# Patient Record
Sex: Female | Born: 1940 | Race: White | Hispanic: No
Health system: Southern US, Community
[De-identification: ages and names within clinical notes are randomized; demographics above are authoritative.]

## PROBLEM LIST (undated history)

## (undated) DIAGNOSIS — J159 Unspecified bacterial pneumonia: Secondary | ICD-10-CM

## (undated) DIAGNOSIS — N184 Chronic kidney disease, stage 4 (severe): Secondary | ICD-10-CM

## (undated) DIAGNOSIS — J9621 Acute and chronic respiratory failure with hypoxia: Secondary | ICD-10-CM

## (undated) DIAGNOSIS — K922 Gastrointestinal hemorrhage, unspecified: Secondary | ICD-10-CM

---

## 2017-10-31 ENCOUNTER — Ambulatory Visit (HOSPITAL_COMMUNITY): Payer: Self-pay | Admitting: Psychiatry

## 2017-12-11 ENCOUNTER — Ambulatory Visit (HOSPITAL_COMMUNITY): Payer: Medicare Other | Admitting: Psychiatry

## 2019-01-01 ENCOUNTER — Inpatient Hospital Stay
Admission: RE | Admit: 2019-01-01 | Discharge: 2019-01-22 | Disposition: A | Discharge status: Other Acute Inpatient Hospital | Payer: Medicare Other | Source: Other Acute Inpatient Hospital | Attending: Internal Medicine | Admitting: Internal Medicine

## 2019-01-01 ENCOUNTER — Other Ambulatory Visit (HOSPITAL_COMMUNITY): Payer: Medicare Other

## 2019-01-01 DIAGNOSIS — Z4901 Encounter for fitting and adjustment of extracorporeal dialysis catheter: Secondary | ICD-10-CM

## 2019-01-01 DIAGNOSIS — K922 Gastrointestinal hemorrhage, unspecified: Secondary | ICD-10-CM | POA: Diagnosis present

## 2019-01-01 DIAGNOSIS — Z0189 Encounter for other specified special examinations: Secondary | ICD-10-CM

## 2019-01-01 DIAGNOSIS — Z931 Gastrostomy status: Secondary | ICD-10-CM

## 2019-01-01 DIAGNOSIS — J9690 Respiratory failure, unspecified, unspecified whether with hypoxia or hypercapnia: Secondary | ICD-10-CM

## 2019-01-01 DIAGNOSIS — R509 Fever, unspecified: Secondary | ICD-10-CM

## 2019-01-01 DIAGNOSIS — J9621 Acute and chronic respiratory failure with hypoxia: Secondary | ICD-10-CM | POA: Diagnosis present

## 2019-01-01 DIAGNOSIS — T829XXA Unspecified complication of cardiac and vascular prosthetic device, implant and graft, initial encounter: Secondary | ICD-10-CM

## 2019-01-01 DIAGNOSIS — J159 Unspecified bacterial pneumonia: Secondary | ICD-10-CM | POA: Diagnosis present

## 2019-01-01 DIAGNOSIS — Z4659 Encounter for fitting and adjustment of other gastrointestinal appliance and device: Secondary | ICD-10-CM

## 2019-01-01 DIAGNOSIS — N179 Acute kidney failure, unspecified: Secondary | ICD-10-CM

## 2019-01-01 DIAGNOSIS — N184 Chronic kidney disease, stage 4 (severe): Secondary | ICD-10-CM | POA: Diagnosis present

## 2019-01-01 DIAGNOSIS — Z992 Dependence on renal dialysis: Secondary | ICD-10-CM

## 2019-01-01 DIAGNOSIS — Z9911 Dependence on respirator [ventilator] status: Secondary | ICD-10-CM

## 2019-01-01 HISTORY — DX: Gastrointestinal hemorrhage, unspecified: K92.2

## 2019-01-01 HISTORY — DX: Chronic kidney disease, stage 4 (severe): N18.4

## 2019-01-01 HISTORY — DX: Acute and chronic respiratory failure with hypoxia: J96.21

## 2019-01-01 HISTORY — DX: Unspecified bacterial pneumonia: J15.9

## 2019-01-02 ENCOUNTER — Other Ambulatory Visit (HOSPITAL_COMMUNITY): Payer: Medicare Other

## 2019-01-02 LAB — CBC WITH DIFFERENTIAL/PLATELET
Abs Immature Granulocytes: 0.08 10*3/uL — ABNORMAL HIGH (ref 0.00–0.07)
Basophils Absolute: 0.1 10*3/uL (ref 0.0–0.1)
Basophils Relative: 1 %
Eosinophils Absolute: 0.4 10*3/uL (ref 0.0–0.5)
Eosinophils Relative: 7 %
HCT: 29.5 % — ABNORMAL LOW (ref 36.0–46.0)
Hemoglobin: 9.3 g/dL — ABNORMAL LOW (ref 12.0–15.0)
Immature Granulocytes: 1 %
Lymphocytes Relative: 14 %
Lymphs Abs: 0.8 10*3/uL (ref 0.7–4.0)
MCH: 28.7 pg (ref 26.0–34.0)
MCHC: 31.5 g/dL (ref 30.0–36.0)
MCV: 91 fL (ref 80.0–100.0)
Monocytes Absolute: 0.4 10*3/uL (ref 0.1–1.0)
Monocytes Relative: 7 %
Neutro Abs: 4.2 10*3/uL (ref 1.7–7.7)
Neutrophils Relative %: 70 %
Platelets: 156 10*3/uL (ref 150–400)
RBC: 3.24 MIL/uL — ABNORMAL LOW (ref 3.87–5.11)
RDW: 16.5 % — ABNORMAL HIGH (ref 11.5–15.5)
WBC: 5.9 10*3/uL (ref 4.0–10.5)
nRBC: 0 % (ref 0.0–0.2)

## 2019-01-02 LAB — COMPREHENSIVE METABOLIC PANEL
ALT: 19 U/L (ref 0–44)
AST: 23 U/L (ref 15–41)
Albumin: 2.3 g/dL — ABNORMAL LOW (ref 3.5–5.0)
Alkaline Phosphatase: 147 U/L — ABNORMAL HIGH (ref 38–126)
Anion gap: 13 (ref 5–15)
BUN: 112 mg/dL — ABNORMAL HIGH (ref 8–23)
CO2: 26 mmol/L (ref 22–32)
Calcium: 9.7 mg/dL (ref 8.9–10.3)
Chloride: 102 mmol/L (ref 98–111)
Creatinine, Ser: 3.44 mg/dL — ABNORMAL HIGH (ref 0.44–1.00)
GFR calc Af Amer: 14 mL/min — ABNORMAL LOW (ref >60)
GFR calc non Af Amer: 12 mL/min — ABNORMAL LOW (ref >60)
Glucose, Bld: 92 mg/dL (ref 70–99)
Potassium: 4.2 mmol/L (ref 3.5–5.1)
Sodium: 141 mmol/L (ref 135–145)
Total Bilirubin: 1.1 mg/dL (ref 0.3–1.2)
Total Protein: 5.6 g/dL — ABNORMAL LOW (ref 6.5–8.1)

## 2019-01-02 LAB — PROTIME-INR
INR: 1.2 (ref 0.8–1.2)
Prothrombin Time: 15.4 seconds — ABNORMAL HIGH (ref 11.4–15.2)

## 2019-01-02 LAB — BLOOD GAS, ARTERIAL
Acid-Base Excess: 0.4 mmol/L (ref 0.0–2.0)
Bicarbonate: 25.5 mmol/L (ref 20.0–28.0)
FIO2: 35
MECHVT: 400 mL
O2 Saturation: 94.9 %
PEEP: 5 cmH2O
Patient temperature: 98.6
RATE: 18 resp/min
pCO2 arterial: 48.2 mmHg — ABNORMAL HIGH (ref 32.0–48.0)
pH, Arterial: 7.343 — ABNORMAL LOW (ref 7.350–7.450)
pO2, Arterial: 78.4 mmHg — ABNORMAL LOW (ref 83.0–108.0)

## 2019-01-02 LAB — TSH: TSH: 5.474 u[IU]/mL — ABNORMAL HIGH (ref 0.350–4.500)

## 2019-01-02 LAB — PHOSPHORUS: Phosphorus: 4.6 mg/dL (ref 2.5–4.6)

## 2019-01-02 LAB — T4, FREE: Free T4: 1.02 ng/dL (ref 0.61–1.12)

## 2019-01-02 LAB — MAGNESIUM: Magnesium: 1.6 mg/dL — ABNORMAL LOW (ref 1.7–2.4)

## 2019-01-02 LAB — VANCOMYCIN, RANDOM: Vancomycin Rm: 25

## 2019-01-03 LAB — RENAL FUNCTION PANEL
Albumin: 2 g/dL — ABNORMAL LOW (ref 3.5–5.0)
Anion gap: 16 — ABNORMAL HIGH (ref 5–15)
BUN: 117 mg/dL — ABNORMAL HIGH (ref 8–23)
CO2: 24 mmol/L (ref 22–32)
Calcium: 9.5 mg/dL (ref 8.9–10.3)
Chloride: 101 mmol/L (ref 98–111)
Creatinine, Ser: 3.64 mg/dL — ABNORMAL HIGH (ref 0.44–1.00)
GFR calc Af Amer: 13 mL/min — ABNORMAL LOW (ref >60)
GFR calc non Af Amer: 11 mL/min — ABNORMAL LOW (ref >60)
Glucose, Bld: 74 mg/dL (ref 70–99)
Phosphorus: 5.3 mg/dL — ABNORMAL HIGH (ref 2.5–4.6)
Potassium: 4 mmol/L (ref 3.5–5.1)
Sodium: 141 mmol/L (ref 135–145)

## 2019-01-03 LAB — MAGNESIUM: Magnesium: 1.9 mg/dL (ref 1.7–2.4)

## 2019-01-04 ENCOUNTER — Encounter (HOSPITAL_COMMUNITY): Payer: Self-pay | Admitting: Interventional Radiology

## 2019-01-04 ENCOUNTER — Other Ambulatory Visit (HOSPITAL_COMMUNITY): Payer: Medicare Other

## 2019-01-04 DIAGNOSIS — K922 Gastrointestinal hemorrhage, unspecified: Secondary | ICD-10-CM | POA: Diagnosis not present

## 2019-01-04 DIAGNOSIS — J159 Unspecified bacterial pneumonia: Secondary | ICD-10-CM

## 2019-01-04 DIAGNOSIS — J9621 Acute and chronic respiratory failure with hypoxia: Secondary | ICD-10-CM

## 2019-01-04 DIAGNOSIS — N184 Chronic kidney disease, stage 4 (severe): Secondary | ICD-10-CM | POA: Diagnosis not present

## 2019-01-04 DIAGNOSIS — Z9911 Dependence on respirator [ventilator] status: Secondary | ICD-10-CM

## 2019-01-04 HISTORY — PX: IR FLUORO GUIDE CV LINE LEFT: IMG2282

## 2019-01-04 HISTORY — PX: IR US GUIDE VASC ACCESS LEFT: IMG2389

## 2019-01-04 LAB — CBC
HCT: 27.5 % — ABNORMAL LOW (ref 36.0–46.0)
Hemoglobin: 8.5 g/dL — ABNORMAL LOW (ref 12.0–15.0)
MCH: 28.2 pg (ref 26.0–34.0)
MCHC: 30.9 g/dL (ref 30.0–36.0)
MCV: 91.4 fL (ref 80.0–100.0)
Platelets: 162 10*3/uL (ref 150–400)
RBC: 3.01 MIL/uL — ABNORMAL LOW (ref 3.87–5.11)
RDW: 16.1 % — ABNORMAL HIGH (ref 11.5–15.5)
WBC: 6.5 10*3/uL (ref 4.0–10.5)
nRBC: 0 % (ref 0.0–0.2)

## 2019-01-04 LAB — URINALYSIS, ROUTINE W REFLEX MICROSCOPIC
Bilirubin Urine: NEGATIVE
Glucose, UA: NEGATIVE mg/dL
Ketones, ur: NEGATIVE mg/dL
Nitrite: NEGATIVE
Protein, ur: 30 mg/dL — AB
RBC / HPF: >50 RBC/hpf — ABNORMAL HIGH (ref 0–5)
Specific Gravity, Urine: 1.012 (ref 1.005–1.030)
WBC, UA: >50 WBC/hpf — ABNORMAL HIGH (ref 0–5)
pH: 5 (ref 5.0–8.0)

## 2019-01-04 LAB — RENAL FUNCTION PANEL
Albumin: 1.9 g/dL — ABNORMAL LOW (ref 3.5–5.0)
Anion gap: 12 (ref 5–15)
BUN: 122 mg/dL — ABNORMAL HIGH (ref 8–23)
CO2: 26 mmol/L (ref 22–32)
Calcium: 9.1 mg/dL (ref 8.9–10.3)
Chloride: 102 mmol/L (ref 98–111)
Creatinine, Ser: 3.92 mg/dL — ABNORMAL HIGH (ref 0.44–1.00)
GFR calc Af Amer: 12 mL/min — ABNORMAL LOW (ref >60)
GFR calc non Af Amer: 10 mL/min — ABNORMAL LOW (ref >60)
Glucose, Bld: 131 mg/dL — ABNORMAL HIGH (ref 70–99)
Phosphorus: 4.6 mg/dL (ref 2.5–4.6)
Potassium: 3.9 mmol/L (ref 3.5–5.1)
Sodium: 140 mmol/L (ref 135–145)

## 2019-01-04 LAB — CLOSTRIDIUM DIFFICILE BY PCR, REFLEXED: Toxigenic C. Difficile by PCR: POSITIVE — AB

## 2019-01-04 LAB — VANCOMYCIN, TROUGH: Vancomycin Tr: 29 ug/mL (ref 15–20)

## 2019-01-04 LAB — C DIFFICILE QUICK SCREEN W PCR REFLEX
C Diff antigen: POSITIVE — AB
C Diff toxin: NEGATIVE

## 2019-01-04 MED ORDER — HEPARIN SODIUM (PORCINE) 1000 UNIT/ML IJ SOLN
INTRAMUSCULAR | Status: AC
  Administered 2019-01-04: 2.8 mL
  Filled 2019-01-04: qty 1

## 2019-01-04 MED ORDER — LIDOCAINE HCL 1 % IJ SOLN
INTRAMUSCULAR | Status: AC
  Filled 2019-01-04: qty 20

## 2019-01-04 MED ORDER — LIDOCAINE HCL (PF) 1 % IJ SOLN
INTRAMUSCULAR | Status: DC | PRN
  Administered 2019-01-04: 30 mL

## 2019-01-04 NOTE — Procedures (Signed)
  Procedure: L IJ Trialysis 20cm HD catheter   EBL:   minimal Complications:  none immediate  See full dictation in [facility].  Dillard Cannon MD Main # [phone] Pager  [phone]

## 2019-01-04 NOTE — Consult Note (Signed)
[facility]  PULMONARY SERVICE  Date of Service: 01/04/2019  PULMONARY CRITICAL CARE CONSULT   Vincenta Szoke  [id number]  DOB: 28-Aug-1940   DOA: 01/01/2019  Referring Physician: Merton Border, MD  HPI: Stephanie Stark is a 78 y.o. female seen for follow up of Acute on Chronic Respiratory Failure.  Patient has multiple medical problems including congestive heart failure chronic kidney disease hyperlipidemia rheumatoid arthritis fibromyalgia giant cell arteritis hypertension aortic aneurysm duodenal ulcers diverticulosis who presented to the hospital because of increasing shortness of breath.  Patient apparently was having a.m. upper and lower GI bleed and required multiple transfusions.  She did undergo evaluation including colonoscopy and distal diverticulosis which was apparently severe.  The patient subsequently did develop acute renal failure with oliguria patient did develop respiratory failure and ended up intubated on the ventilator.  Right now patient is on ventilator and is on pressure support mode currently on 40% FiO2 with volumes of about 382 cc.  We are asked to see her for further management and weaning.  Review of Systems:  ROS performed and is unremarkable other than noted above.  Allergies  Allergen Reactions  . Shrimp Rash  Rash and swelling  . Keflex [Cephalexin] Diarrhea   Past Medical History:  Diagnosis Date  . (HFpEF) heart failure with preserved ejection fraction (CMS-HCC) 06/24/2017  . Arthritis, rheumatoid (CMS-HCC)  . CHF NYHA class II, chronic, diastolic (CMS-HCC) 123XX123  . CKD (chronic kidney disease) stage 2, GFR 60-89 ml/min 11/12/2016  . Dyslipidemia  . Fibromyalgia  . Giant cell arteritis (CMS-HCC)  . HTN (hypertension)  . Nonrheumatic aortic valve insufficiency 06/17/2017  . Thoracic ascending aortic aneurysm (CMS-HCC) 11/12/2016   Past Surgical History:  Procedure Laterality Date  .  CHOLECYSTECTOMY  . hip fracture repair Right  . HYSTERECTOMY VAGINAL  . REPLACEMENT TOTAL KNEE Left     Medications: Reviewed on Rounds  Physical Exam:  Vitals: Temperature 98.7 pulse 101 respiratory 18 blood pressure 128/66 saturations 100%  Ventilator Settings mode of ventilation pressure support FiO2 is 40% tidal volume 382 pressure support 12 PEEP 5  . General: Comfortable at this time . Eyes: Grossly normal lids, irises & conjunctiva . ENT: grossly tongue is normal . Neck: no obvious mass . Cardiovascular: S1-S2 normal no gallop or rub . Respiratory: No rhonchi no rales are noted . Abdomen: Soft and nontender . Skin: no rash seen on limited exam . Musculoskeletal: not rigid . Psychiatric:unable to assess . Neurologic: no seizure no involuntary movements         Labs on Admission:  Basic Metabolic Panel: Recent Labs  Lab 01/02/19 0557 01/03/19 0629 01/04/19 0807  NA 141 141 140  K 4.2 4.0 3.9  CL 102 101 102  CO2 26 24 26   GLUCOSE 92 74 131*  BUN 112* 117* 122*  CREATININE 3.44* 3.64* 3.92*  CALCIUM 9.7 9.5 9.1  MG 1.6* 1.9  --   PHOS 4.6 5.3* 4.6    Recent Labs  Lab 01/02/19 0400  PHART 7.343*  PCO2ART 48.2*  PO2ART 78.4*  HCO3 25.5  O2SAT 94.9    Liver Function Tests: Recent Labs  Lab 01/02/19 0557 01/03/19 0629 01/04/19 0807  AST 23  --   --   ALT 19  --   --   ALKPHOS 147*  --   --   BILITOT 1.1  --   --   PROT 5.6*  --   --   ALBUMIN 2.3* 2.0*  1.9*   No results for input(s): LIPASE, AMYLASE in the last 168 hours. No results for input(s): AMMONIA in the last 168 hours.  CBC: Recent Labs  Lab 01/02/19 0557 01/04/19 0807  WBC 5.9 6.5  NEUTROABS 4.2  --   HGB 9.3* 8.5*  HCT 29.5* 27.5*  MCV 91.0 91.4  PLT 156 162    Cardiac Enzymes: No results for input(s): CKTOTAL, CKMB, CKMBINDEX, TROPONINI in the last 168 hours.  BNP (last 3 results) No results for input(s): BNP in the last 8760 hours.  ProBNP (last 3 results) No  results for input(s): PROBNP in the last 8760 hours.   Radiological Exams on Admission: Dg Abd 1 View  Result Date: 01/02/2019 CLINICAL DATA:  Evaluate NG tube placement EXAM: ABDOMEN - 1 VIEW COMPARISON:  Earlier today. FINDINGS: 1207 hours. NG tube tip is in the mid stomach with proximal port of the NG tube at or just distal to the EG junction. Nonspecific bowel gas pattern without substantial small bowel or colonic gaseous dilatation. The radiopaque foreign bodies over the right abdomen are similar to prior. Degenerative changes noted lumbar spine. IMPRESSION: NG tube tip is in the mid stomach. Electronically Signed   By: Misty Stanley M.D.   On: 01/02/2019 12:37   Dg Abd 1 View  Result Date: 01/01/2019 CLINICAL DATA:  NG tube placement. EXAM: ABDOMEN - 1 VIEW COMPARISON:  None. FINDINGS: NG tube tip is in the left upper quadrant of the abdomen, probably in the midbody of the stomach. No dilated bowel. No acute abnormality of the visualized portion of the abdomen. IMPRESSION: NG tube tip in the midbody of the stomach. Electronically Signed   By: Lorriane Shire M.D.   On: 01/01/2019 20:48   US Renal  Result Date: 01/02/2019 CLINICAL DATA:  Acute renal failure. EXAM: RENAL / URINARY TRACT ULTRASOUND COMPLETE COMPARISON:  None. FINDINGS: Right Kidney: Renal measurements: 8.6 x 4.5 x 4.7 cm = volume: 93.3 mL . Echogenicity within normal limits. No mass or hydronephrosis visualized. Left Kidney: Renal measurements: 10.6 x 4.1 x 4.6 cm = volume: 104.4 mL. Echogenicity within normal limits. No mass or hydronephrosis visualized. Bladder: Decompressed with Foley catheter. Left pleural effusion. IMPRESSION: No hydronephrosis. Electronically Signed   By: Lovey Newcomer M.D.   On: 01/02/2019 14:17   Ir Fluoro Guide Cv Line Left  Result Date: 01/04/2019 CLINICAL DATA:  Chronic kidney disease stage 4, needs access for hemodialysis EXAM: EXAM RIGHT IJ CATHETER PLACEMENT UNDER ULTRASOUND AND FLUOROSCOPIC GUIDANCE  TECHNIQUE: Patency of the left IJ vein was confirmed with ultrasound with image documentation. An appropriate skin site was determined. Skin site was marked. Region was prepped using maximum barrier technique including cap and mask, sterile gown, sterile gloves, large sterile sheet, and Chlorhexidine as cutaneous antisepsis. The region was infiltrated locally with 1% lidocaine. Under real-time ultrasound guidance, the left IJ vein was accessed with micropuncture set; the needle tip within the vein was confirmed with ultrasound image documentation. The micropuncture dilator over a guidewire for vascular dilator which allowed advancement of a 20 cm Trialysis catheter. This was positioned with the tip at the cavoatrial junction. Spot chest radiograph shows good positioning and no pneumothorax. Catheter was flushed and sutured externally with 0-Prolene sutures. Patient tolerated the procedure well. FLUOROSCOPY TIME:  0.2 minute; 11 uGym2 DAP COMPLICATIONS: COMPLICATIONS none IMPRESSION: 1. Technically successful left IJ Trialysis catheter placement. Electronically Signed   By: Lucrezia Europe M.D.   On: 01/04/2019 16:39   Ir US  Guide Vasc Access Left  Result Date: 01/04/2019 CLINICAL DATA:  Chronic kidney disease stage 4, needs access for hemodialysis EXAM: EXAM RIGHT IJ CATHETER PLACEMENT UNDER ULTRASOUND AND FLUOROSCOPIC GUIDANCE TECHNIQUE: Patency of the left IJ vein was confirmed with ultrasound with image documentation. An appropriate skin site was determined. Skin site was marked. Region was prepped using maximum barrier technique including cap and mask, sterile gown, sterile gloves, large sterile sheet, and Chlorhexidine as cutaneous antisepsis. The region was infiltrated locally with 1% lidocaine. Under real-time ultrasound guidance, the left IJ vein was accessed with micropuncture set; the needle tip within the vein was confirmed with ultrasound image documentation. The micropuncture dilator over a guidewire for  vascular dilator which allowed advancement of a 20 cm Trialysis catheter. This was positioned with the tip at the cavoatrial junction. Spot chest radiograph shows good positioning and no pneumothorax. Catheter was flushed and sutured externally with 0-Prolene sutures. Patient tolerated the procedure well. FLUOROSCOPY TIME:  0.2 minute; 11 uGym2 DAP COMPLICATIONS: COMPLICATIONS none IMPRESSION: 1. Technically successful left IJ Trialysis catheter placement. Electronically Signed   By: Lucrezia Europe M.D.   On: 01/04/2019 16:39   Dg Chest Port 1 View  Result Date: 01/02/2019 CLINICAL DATA:  78 year old female with history of respiratory failure. EXAM: PORTABLE CHEST 1 VIEW COMPARISON:  No priors. FINDINGS: An endotracheal tube is in place with tip 3.2 cm above the carina. A nasogastric tube is seen extending into the stomach, however, the tip of the nasogastric tube extends below the lower margin of the image. Patchy multifocal interstitial and airspace disease throughout the lungs bilaterally, with relative sparing in the right lung base. Moderate left and small right pleural effusions. Heart size is borderline enlarged. The patient is rotated to the right on today's exam, resulting in distortion of the mediastinal contours and reduced diagnostic sensitivity and specificity for mediastinal pathology. IMPRESSION: 1. Support apparatus, as above. 2. The appearance the chest suggests severe multilobar pneumonia. 3. Moderate left and small right pleural effusions. Electronically Signed   By: Vinnie Langton M.D.   On: 01/02/2019 08:38   Dg Abd Portable 1v  Result Date: 01/02/2019 CLINICAL DATA:  NG tube placement. EXAM: PORTABLE ABDOMEN - 1 VIEW COMPARISON:  01/01/2019 FINDINGS: Patient rotated to the right. Nasogastric tube is present with tip over the stomach just left of midline as the side port is in the region of the gastroesophageal junction. Bowel gas pattern is nonobstructive. Remainder of the exam is  unchanged. IMPRESSION: Nonobstructive bowel gas pattern. Nasogastric tube with tip over the stomach in the left upper quadrant and side-port at the level of the gastroesophageal junction. Electronically Signed   By: Marin Olp M.D.   On: 01/02/2019 08:38    Assessment/Plan Active Problems:   Acute on chronic respiratory failure with hypoxia (HCC)   Chronic kidney disease, stage IV (severe) (HCC)   Acute lower GI bleeding   Bacterial lobar pneumonia   1. Acute on chronic respiratory failure with hypoxia at this time patient is on the wean protocol started on pressure support 12/5.  We will continue to advance on the wean as tolerated.  Continue secretion management pulmonary toilet. 2. Chronic kidney disease stage IV nephrology is following the patient for the urine output.  Patient will be started on hemodialysis. 3. Acute GI bleed we will monitor the labs closely monitor the hemoglobin and transfuse as necessary.  The patient did have an abdominal x-ray which shows a nonobstructive bowel gas pattern. 4. Multi lobar  pneumonia chest x-ray still showing significant lobar pneumonia along with a moderate left and right-sided pleural effusion.  We will continue with supportive care discussed antibiotics with primary care team  I have personally seen and evaluated the patient, evaluated laboratory and imaging results, formulated the assessment and plan and placed orders. The Patient requires high complexity decision making for assessment and support.  Case was discussed on Rounds with the Respiratory Therapy Staff Time Spent 29minutes  Allyne Gee, MD [facility] Pulmonary Critical Care Medicine Sleep Medicine

## 2019-01-04 NOTE — Consult Note (Signed)
[facility] CONSULT NOTE    Date: 01/04/2019                  Patient Name:  Stephanie Stark  MRN: [id number]  DOB: 1940/09/26  Age / Sex: 78 y.o., female         PCP: Kennieth Rad, MD                 Service Requesting Consult: Hospitalist                 Reason for Consult: Acute renal failure/CKD stage IV            History of Present Illness: Patient is a 78 y.o. female with a PMHx of congestive heart failure, rheumatoid arthritis, chronic kidney disease stage IV, hyperlipidemia, fibromyalgia, giant cell arteritis, hypertension, thoracic ascending aortic aneurysm, duodenal ulcers, severe diverticulosis, coronary artery disease, hypothyroidism, who was admitted to [facility] on 01/01/2019 for ongoing treatment of acute respiratory failure, generalized debility, and severe renal dysfunction.  She was admitted to outside hospital for upper and lower GI bleed and received multiple blood transfusions.  She had colonoscopy performed on November 27, 2018 which demonstrated moderate diverticulosis throughout the entire colon with severe diverticulosis in the sigmoid colon.  Patient developed acute kidney injury and was started on Lasix drip.  Urine output over the past 24 hours was 650 cc.  However BUN is quite high at greater than 100.  We discussed initiation of dialysis this weekend.   Medications:  Current medications: Fentanyl drip, Lasix drip, amlodipine 5 mg 2 tablets daily, carvedilol 12.5 mg twice daily, hydralazine 25 mg 3 times daily, levothyroxine 25 mcg daily, magnesium oxide 400 mg twice daily, mirtazapine 15 mg nightly, pantoprazole 40 mg twice daily, sucralfate 1 g every 6 hours  Allergies: Cephalexin   Past Medical History: congestive heart failure, rheumatoid arthritis, chronic kidney disease stage IV, hyperlipidemia, fibromyalgia, giant cell arteritis, hypertension, thoracic ascending aortic aneurysm, duodenal ulcers, severe diverticulosis, coronary  artery disease, hypothyroidism,  Past Surgical History: . CHOLECYSTECTOMY  . hip fracture repair Right  . HYSTERECTOMY VAGINAL  . REPLACEMENT TOTAL KNEE Left    Family History: Unable to obtain from the patient as she is currently on the ventilator  Social History: Unable to obtain from the patient as she is currently on the ventilator  Review of Systems: Unable to obtain from the patient as she is currently on the ventilator  Vital Signs: Temperature 96.9 pulse 73 respirations 20 blood pressure 127/67  Physical Exam: General: Critically ill-appearing  Head: Normocephalic, atraumatic.  Eyes: Anicteric, EOMI  Nose: Nasogastric tube in place.  Throat: Endotracheal tube noted to be in place  Neck: Supple, trachea midline.  Lungs:  Bilateral rhonchi, vent assisted  Heart: S1S2 no rubs  Abdomen:  Mild distension, BS present  Extremities: Marked edema in all 4 extremeties  Neurologic: Arousable but not following commands  Skin: Weeping from both upper extremities    Lab results: Basic Metabolic Panel: Recent Labs  Lab 01/02/19 0557 01/03/19 0629  NA 141 141  K 4.2 4.0  CL 102 101  CO2 26 24  GLUCOSE 92 74  BUN 112* 117*  CREATININE 3.44* 3.64*  CALCIUM 9.7 9.5  MG 1.6* 1.9  PHOS 4.6 5.3*    Liver Function Tests: Recent Labs  Lab 01/02/19 0557 01/03/19 0629  AST 23  --   ALT 19  --   ALKPHOS 147*  --   BILITOT 1.1  --  PROT 5.6*  --   ALBUMIN 2.3* 2.0*   No results for input(s): LIPASE, AMYLASE in the last 168 hours. No results for input(s): AMMONIA in the last 168 hours.  CBC: Recent Labs  Lab 01/02/19 0557  WBC 5.9  NEUTROABS 4.2  HGB 9.3*  HCT 29.5*  MCV 91.0  PLT 156    Cardiac Enzymes: No results for input(s): CKTOTAL, CKMB, CKMBINDEX, TROPONINI in the last 168 hours.  BNP: Invalid input(s): POCBNP  CBG: No results for input(s): GLUCAP in the last 168 hours.  Microbiology: No results found for this or any previous  visit.  Coagulation Studies: Recent Labs    01/02/19 0557  LABPROT 15.4*  INR 1.2    Urinalysis: No results for input(s): COLORURINE, LABSPEC, PHURINE, GLUCOSEU, HGBUR, BILIRUBINUR, KETONESUR, PROTEINUR, UROBILINOGEN, NITRITE, LEUKOCYTESUR in the last 72 hours.  Invalid input(s): APPERANCEUR    Imaging: Dg Abd 1 View  Result Date: 01/02/2019 CLINICAL DATA:  Evaluate NG tube placement EXAM: ABDOMEN - 1 VIEW COMPARISON:  Earlier today. FINDINGS: 1207 hours. NG tube tip is in the mid stomach with proximal port of the NG tube at or just distal to the EG junction. Nonspecific bowel gas pattern without substantial small bowel or colonic gaseous dilatation. The radiopaque foreign bodies over the right abdomen are similar to prior. Degenerative changes noted lumbar spine. IMPRESSION: NG tube tip is in the mid stomach. Electronically Signed   By: Misty Stanley M.D.   On: 01/02/2019 12:37   US Renal  Result Date: 01/02/2019 CLINICAL DATA:  Acute renal failure. EXAM: RENAL / URINARY TRACT ULTRASOUND COMPLETE COMPARISON:  None. FINDINGS: Right Kidney: Renal measurements: 8.6 x 4.5 x 4.7 cm = volume: 93.3 mL . Echogenicity within normal limits. No mass or hydronephrosis visualized. Left Kidney: Renal measurements: 10.6 x 4.1 x 4.6 cm = volume: 104.4 mL. Echogenicity within normal limits. No mass or hydronephrosis visualized. Bladder: Decompressed with Foley catheter. Left pleural effusion. IMPRESSION: No hydronephrosis. Electronically Signed   By: Lovey Newcomer M.D.   On: 01/02/2019 14:17      Assessment & Plan: Pt is a 78 y.o. female with a PMHx of congestive heart failure, rheumatoid arthritis, chronic kidney disease stage IV, hyperlipidemia, fibromyalgia, giant cell arteritis, hypertension, thoracic ascending aortic aneurysm, duodenal ulcers, severe diverticulosis, coronary artery disease, hypothyroidism, who was admitted to [facility] on 01/01/2019 for ongoing treatment of acute respiratory failure,  generalized debility, and severe renal dysfunction.   1.  Acute renal failure/chronic kidney disease stage IV.  Patient has significant renal dysfunction.  Urine output was only 650 cc and the patient is markedly volume overloaded.  We will start the patient on hemodialysis today.  Treatment time will be 1.5 hours with a blood flow rate of 150 and dialysate flow rate of 300.  2.  Acute respiratory failure.  Patient maintained on the ventilator.  Hopefully with ultrafiltration will be better able to wean the patient off the ventilator.  3.  Anemia of chronic kidney disease.  Most recent hemoglobin was 9.3.  Consider starting medically at this admission.  4.  Overall prognosis guarded.

## 2019-01-05 DIAGNOSIS — J9621 Acute and chronic respiratory failure with hypoxia: Secondary | ICD-10-CM | POA: Diagnosis not present

## 2019-01-05 DIAGNOSIS — K922 Gastrointestinal hemorrhage, unspecified: Secondary | ICD-10-CM | POA: Diagnosis not present

## 2019-01-05 DIAGNOSIS — J159 Unspecified bacterial pneumonia: Secondary | ICD-10-CM | POA: Diagnosis not present

## 2019-01-05 DIAGNOSIS — N184 Chronic kidney disease, stage 4 (severe): Secondary | ICD-10-CM | POA: Diagnosis not present

## 2019-01-05 LAB — RENAL FUNCTION PANEL
Albumin: 1.9 g/dL — ABNORMAL LOW (ref 3.5–5.0)
Anion gap: 14 (ref 5–15)
BUN: 100 mg/dL — ABNORMAL HIGH (ref 8–23)
CO2: 22 mmol/L (ref 22–32)
Calcium: 9.1 mg/dL (ref 8.9–10.3)
Chloride: 101 mmol/L (ref 98–111)
Creatinine, Ser: 3.39 mg/dL — ABNORMAL HIGH (ref 0.44–1.00)
GFR calc Af Amer: 14 mL/min — ABNORMAL LOW (ref >60)
GFR calc non Af Amer: 12 mL/min — ABNORMAL LOW (ref >60)
Glucose, Bld: 145 mg/dL — ABNORMAL HIGH (ref 70–99)
Phosphorus: 4.2 mg/dL (ref 2.5–4.6)
Potassium: 5 mmol/L (ref 3.5–5.1)
Sodium: 137 mmol/L (ref 135–145)

## 2019-01-05 LAB — CBC
HCT: 27.7 % — ABNORMAL LOW (ref 36.0–46.0)
Hemoglobin: 8.7 g/dL — ABNORMAL LOW (ref 12.0–15.0)
MCH: 29.1 pg (ref 26.0–34.0)
MCHC: 31.4 g/dL (ref 30.0–36.0)
MCV: 92.6 fL (ref 80.0–100.0)
Platelets: 159 10*3/uL (ref 150–400)
RBC: 2.99 MIL/uL — ABNORMAL LOW (ref 3.87–5.11)
RDW: 16.3 % — ABNORMAL HIGH (ref 11.5–15.5)
WBC: 8.1 10*3/uL (ref 4.0–10.5)
nRBC: 0 % (ref 0.0–0.2)

## 2019-01-05 LAB — HEPATITIS B SURFACE ANTIGEN: Hepatitis B Surface Ag: NEGATIVE

## 2019-01-05 LAB — URINE CULTURE: Culture: >=100000 — AB

## 2019-01-05 LAB — MAGNESIUM: Magnesium: 1.9 mg/dL (ref 1.7–2.4)

## 2019-01-05 NOTE — Progress Notes (Addendum)
[facility] [place]   PULMONARY CRITICAL CARE SERVICE  PROGRESS NOTE  Date of Service: 01/05/2019  Stephanie Stark  [name]  DOB: February 18, 1941   DOA: 01/01/2019  Referring Physician: Merton Border, MD  HPI: Stephanie Stark is a 78 y.o. female seen for follow up of Acute on Chronic Respiratory Failure.  Patient had a 4-hour goal on pressure support today however failed and is back on full support on the ventilator.  Medications: Reviewed on Rounds  Physical Exam:  Vitals: Pulse 106 respirations 28 BP 124/62 O2 sat 94% temp 97.0  Ventilator Settings ventilator mode AC VC rate of 18 tidal volume 400 PEEP of 5 and FiO2 of 35%  . General: Comfortable at this time . Eyes: Grossly normal lids, irises & conjunctiva . ENT: grossly tongue is normal . Neck: no obvious mass . Cardiovascular: S1 S2 normal no gallop . Respiratory: No rales or rhonchi noted . Abdomen: soft . Skin: no rash seen on limited exam . Musculoskeletal: not rigid . Psychiatric:unable to assess . Neurologic: no seizure no involuntary movements         Lab Data:   Basic Metabolic Panel: Recent Labs  Lab 01/02/19 0557 01/03/19 0629 01/04/19 0807 01/05/19 0643  NA 141 141 140 137  K 4.2 4.0 3.9 5.0  CL 102 101 102 101  CO2 26 24 26 22   GLUCOSE 92 74 131* 145*  BUN 112* 117* 122* 100*  CREATININE 3.44* 3.64* 3.92* 3.39*  CALCIUM 9.7 9.5 9.1 9.1  MG 1.6* 1.9  --  1.9  PHOS 4.6 5.3* 4.6 4.2    ABG: Recent Labs  Lab 01/02/19 0400  PHART 7.343*  PCO2ART 48.2*  PO2ART 78.4*  HCO3 25.5  O2SAT 94.9    Liver Function Tests: Recent Labs  Lab 01/02/19 0557 01/03/19 0629 01/04/19 0807 01/05/19 0643  AST 23  --   --   --   ALT 19  --   --   --   ALKPHOS 147*  --   --   --   BILITOT 1.1  --   --   --   PROT 5.6*  --   --   --   ALBUMIN 2.3* 2.0* 1.9* 1.9*   No results for input(s): LIPASE, AMYLASE in the last 168 hours. No results for input(s):  AMMONIA in the last 168 hours.  CBC: Recent Labs  Lab 01/02/19 0557 01/04/19 0807 01/05/19 0643  WBC 5.9 6.5 8.1  NEUTROABS 4.2  --   --   HGB 9.3* 8.5* 8.7*  HCT 29.5* 27.5* 27.7*  MCV 91.0 91.4 92.6  PLT 156 162 159    Cardiac Enzymes: No results for input(s): CKTOTAL, CKMB, CKMBINDEX, TROPONINI in the last 168 hours.  BNP (last 3 results) No results for input(s): BNP in the last 8760 hours.  ProBNP (last 3 results) No results for input(s): PROBNP in the last 8760 hours.  Radiological Exams: Ir Fluoro Guide Cv Line Left  Result Date: 01/04/2019 CLINICAL DATA:  Chronic kidney disease stage 4, needs access for hemodialysis EXAM: EXAM RIGHT IJ CATHETER PLACEMENT UNDER ULTRASOUND AND FLUOROSCOPIC GUIDANCE TECHNIQUE: Patency of the left IJ vein was confirmed with ultrasound with image documentation. An appropriate skin site was determined. Skin site was marked. Region was prepped using maximum barrier technique including cap and mask, sterile gown, sterile gloves, large sterile sheet, and Chlorhexidine as cutaneous antisepsis. The region was infiltrated locally with 1% lidocaine. Under real-time ultrasound guidance, the left  IJ vein was accessed with micropuncture set; the needle tip within the vein was confirmed with ultrasound image documentation. The micropuncture dilator over a guidewire for vascular dilator which allowed advancement of a 20 cm Trialysis catheter. This was positioned with the tip at the cavoatrial junction. Spot chest radiograph shows good positioning and no pneumothorax. Catheter was flushed and sutured externally with 0-Prolene sutures. Patient tolerated the procedure well. FLUOROSCOPY TIME:  0.2 minute; 11 uGym2 DAP COMPLICATIONS: COMPLICATIONS none IMPRESSION: 1. Technically successful left IJ Trialysis catheter placement. Electronically Signed   By: Lucrezia Europe M.D.   On: 01/04/2019 16:39   Ir US Guide Vasc Access Left  Result Date: 01/04/2019 CLINICAL DATA:   Chronic kidney disease stage 4, needs access for hemodialysis EXAM: EXAM RIGHT IJ CATHETER PLACEMENT UNDER ULTRASOUND AND FLUOROSCOPIC GUIDANCE TECHNIQUE: Patency of the left IJ vein was confirmed with ultrasound with image documentation. An appropriate skin site was determined. Skin site was marked. Region was prepped using maximum barrier technique including cap and mask, sterile gown, sterile gloves, large sterile sheet, and Chlorhexidine as cutaneous antisepsis. The region was infiltrated locally with 1% lidocaine. Under real-time ultrasound guidance, the left IJ vein was accessed with micropuncture set; the needle tip within the vein was confirmed with ultrasound image documentation. The micropuncture dilator over a guidewire for vascular dilator which allowed advancement of a 20 cm Trialysis catheter. This was positioned with the tip at the cavoatrial junction. Spot chest radiograph shows good positioning and no pneumothorax. Catheter was flushed and sutured externally with 0-Prolene sutures. Patient tolerated the procedure well. FLUOROSCOPY TIME:  0.2 minute; 11 uGym2 DAP COMPLICATIONS: COMPLICATIONS none IMPRESSION: 1. Technically successful left IJ Trialysis catheter placement. Electronically Signed   By: Lucrezia Europe M.D.   On: 01/04/2019 16:39    Assessment/Plan Active Problems:   Acute on chronic respiratory failure with hypoxia (HCC)   Chronic kidney disease, stage IV (severe) (HCC)   Acute lower GI bleeding   Bacterial lobar pneumonia   1. Acute on chronic respiratory failure with hypoxia patient failed 4-hour wean today on pressure support will continue to attempt weaning this time.  Continue full support as well as with supportive measures and pulmonary toilet. 2. Chronic kidney disease stage IV at baseline we will continue with present management. 3. Acute lower GI bleed no sign of active bleeding 4. Bacterial pneumonia treated we will continue to follow-up radiologically   I have  personally seen and evaluated the patient, evaluated laboratory and imaging results, formulated the assessment and plan and placed orders. The Patient requires high complexity decision making for assessment and support.  Case was discussed on Rounds with the Respiratory Therapy Staff  Allyne Gee, MD [facility] Pulmonary Critical Care Medicine Sleep Medicine

## 2019-01-06 DIAGNOSIS — N184 Chronic kidney disease, stage 4 (severe): Secondary | ICD-10-CM | POA: Diagnosis not present

## 2019-01-06 DIAGNOSIS — J159 Unspecified bacterial pneumonia: Secondary | ICD-10-CM | POA: Diagnosis not present

## 2019-01-06 DIAGNOSIS — J9621 Acute and chronic respiratory failure with hypoxia: Secondary | ICD-10-CM | POA: Diagnosis not present

## 2019-01-06 DIAGNOSIS — K922 Gastrointestinal hemorrhage, unspecified: Secondary | ICD-10-CM | POA: Diagnosis not present

## 2019-01-06 LAB — CBC
HCT: 31.9 % — ABNORMAL LOW (ref 36.0–46.0)
Hemoglobin: 9.7 g/dL — ABNORMAL LOW (ref 12.0–15.0)
MCH: 28.3 pg (ref 26.0–34.0)
MCHC: 30.4 g/dL (ref 30.0–36.0)
MCV: 93 fL (ref 80.0–100.0)
Platelets: 276 10*3/uL (ref 150–400)
RBC: 3.43 MIL/uL — ABNORMAL LOW (ref 3.87–5.11)
RDW: 15.9 % — ABNORMAL HIGH (ref 11.5–15.5)
WBC: 17.2 10*3/uL — ABNORMAL HIGH (ref 4.0–10.5)
nRBC: 0.1 % (ref 0.0–0.2)

## 2019-01-06 LAB — RENAL FUNCTION PANEL
Albumin: 2.1 g/dL — ABNORMAL LOW (ref 3.5–5.0)
Anion gap: 12 (ref 5–15)
BUN: 101 mg/dL — ABNORMAL HIGH (ref 8–23)
CO2: 25 mmol/L (ref 22–32)
Calcium: 9.8 mg/dL (ref 8.9–10.3)
Chloride: 98 mmol/L (ref 98–111)
Creatinine, Ser: 3.57 mg/dL — ABNORMAL HIGH (ref 0.44–1.00)
GFR calc Af Amer: 13 mL/min — ABNORMAL LOW (ref >60)
GFR calc non Af Amer: 11 mL/min — ABNORMAL LOW (ref >60)
Glucose, Bld: 145 mg/dL — ABNORMAL HIGH (ref 70–99)
Phosphorus: 4.6 mg/dL (ref 2.5–4.6)
Potassium: 4.3 mmol/L (ref 3.5–5.1)
Sodium: 135 mmol/L (ref 135–145)

## 2019-01-06 LAB — MAGNESIUM: Magnesium: 2 mg/dL (ref 1.7–2.4)

## 2019-01-06 LAB — CULTURE, RESPIRATORY W GRAM STAIN

## 2019-01-06 LAB — VANCOMYCIN, TROUGH: Vancomycin Tr: 21 ug/mL (ref 15–20)

## 2019-01-06 NOTE — Progress Notes (Signed)
[facility]  ROUNDING NOTE   Subjective:  Patient remains critically ill at the moment. Still on the ventilator. Due for dialysis later today.   Objective:  Vital signs in last 24 hours:  Temperature 96.8 pulse 88 respirations 32 blood pressure 119/75   Physical Exam: General: Critically ill-appearing  Head: Endotracheal tube noted to be in place NG inplace  Eyes: Anicteric  Neck: Supple, trachea midline  Lungs:  Scattered rhonchi rales, vent assisted  Heart: S1S2 no rubs  Abdomen:  Soft, mild distension  Extremities: 4+ peripheral edema.  Neurologic: Arousable but not following commands  Skin: Weeping skin from both upper extremities  Access:  Left IJ temporary dialysis catheter    Basic Metabolic Panel: Recent Labs  Lab 01/02/19 0557 01/03/19 0629 01/04/19 0807 01/05/19 0643 01/06/19 0617  NA 141 141 140 137 135  K 4.2 4.0 3.9 5.0 4.3  CL 102 101 102 101 98  CO2 26 24 26 22 25   GLUCOSE 92 74 131* 145* 145*  BUN 112* 117* 122* 100* 101*  CREATININE 3.44* 3.64* 3.92* 3.39* 3.57*  CALCIUM 9.7 9.5 9.1 9.1 9.8  MG 1.6* 1.9  --  1.9 2.0  PHOS 4.6 5.3* 4.6 4.2 4.6    Liver Function Tests: Recent Labs  Lab 01/02/19 0557 01/03/19 0629 01/04/19 0807 01/05/19 0643 01/06/19 0617  AST 23  --   --   --   --   ALT 19  --   --   --   --   ALKPHOS 147*  --   --   --   --   BILITOT 1.1  --   --   --   --   PROT 5.6*  --   --   --   --   ALBUMIN 2.3* 2.0* 1.9* 1.9* 2.1*   No results for input(s): LIPASE, AMYLASE in the last 168 hours. No results for input(s): AMMONIA in the last 168 hours.  CBC: Recent Labs  Lab 01/02/19 0557 01/04/19 0807 01/05/19 0643 01/06/19 0617  WBC 5.9 6.5 8.1 17.2*  NEUTROABS 4.2  --   --   --   HGB 9.3* 8.5* 8.7* 9.7*  HCT 29.5* 27.5* 27.7* 31.9*  MCV 91.0 91.4 92.6 93.0  PLT 156 162 159 276    Cardiac Enzymes: No results for input(s): CKTOTAL, CKMB, CKMBINDEX, TROPONINI in the last 168 hours.  BNP: Invalid  input(s): POCBNP  CBG: No results for input(s): GLUCAP in the last 168 hours.  Microbiology: Results for orders placed or performed during the hospital encounter of 01/01/19  Culture, Urine     Status: Abnormal   Collection Time: 01/04/19 12:15 PM   Specimen: Urine, Random  Result Value Ref Range Status   Specimen Description URINE, RANDOM  Final   Special Requests   Final    NONE Performed at [facility], [address] [place], [place] [address]    Culture >=100,000 COLONIES/mL YEAST (A)  Final   Report Status 01/05/2019 FINAL  Final  C difficile quick scan w PCR reflex     Status: Abnormal   Collection Time: 01/04/19 12:15 PM   Specimen: STOOL  Result Value Ref Range Status   C Diff antigen POSITIVE (A) NEGATIVE Final   C Diff toxin NEGATIVE NEGATIVE Final   C Diff interpretation Results are indeterminate. See PCR results.  Final    Comment: Performed at [facility], [place] [address], [place], [place] [address]  C. Diff  by PCR, Reflexed     Status: Abnormal   Collection Time: 01/04/19 12:15 PM  Result Value Ref Range Status   Toxigenic C. Difficile by PCR POSITIVE (A) NEGATIVE Final    Comment: Positive for toxigenic C. difficile with little to no toxin production. Only treat if clinical presentation suggests symptomatic illness. Performed at [facility], [place] [address] [place], [place] [address]   Culture, blood (routine x 2)     Status: None (Preliminary result)   Collection Time: 01/04/19 12:40 PM   Specimen: BLOOD  Result Value Ref Range Status   Specimen Description BLOOD RIGHT ANTECUBITAL  Final   Special Requests   Final    BOTTLES DRAWN AEROBIC ONLY Blood Culture results may not be optimal due to an inadequate volume of blood received in culture bottles   Culture   Final    NO GROWTH 2 DAYS Performed at [facility], [place] [address] [place], [place] [address]    Report Status PENDING  Incomplete  Culture, blood (routine x 2)      Status: None (Preliminary result)   Collection Time: 01/04/19 12:45 PM   Specimen: BLOOD RIGHT HAND  Result Value Ref Range Status   Specimen Description BLOOD RIGHT HAND  Final   Special Requests   Final    BOTTLES DRAWN AEROBIC ONLY Blood Culture adequate volume   Culture   Final    NO GROWTH 2 DAYS Performed at [facility], [place] [address], [place], [place] [address]    Report Status PENDING  Incomplete  Culture, respiratory     Status: None   Collection Time: 01/04/19  2:27 PM   Specimen: Tracheal Aspirate  Result Value Ref Range Status   Specimen Description TRACHEAL ASPIRATE  Final   Special Requests NONE  Final   Gram Stain   Final    ABUNDANT WBC PRESENT, PREDOMINANTLY PMN RARE SQUAMOUS EPITHELIAL CELLS PRESENT NO ORGANISMS SEEN Performed at [facility], [place] [address], [place],  [address]    Culture FEW CITROBACTER FREUNDII  Final   Report Status 01/06/2019 FINAL  Final   Organism ID, Bacteria CITROBACTER FREUNDII  Final      Susceptibility   Citrobacter freundii - MIC*    CEFAZOLIN >=64 RESISTANT Resistant     CEFEPIME <=1 SENSITIVE Sensitive     CEFTAZIDIME >=64 RESISTANT Resistant     CEFTRIAXONE >=64 RESISTANT Resistant     CIPROFLOXACIN <=0.25 SENSITIVE Sensitive     GENTAMICIN <=1 SENSITIVE Sensitive     IMIPENEM <=0.25 SENSITIVE Sensitive     TRIMETH/SULFA <=20 SENSITIVE Sensitive     PIP/TAZO 64 INTERMEDIATE Intermediate     * FEW CITROBACTER FREUNDII    Coagulation Studies: No results for input(s): LABPROT, INR in the last 72 hours.  Urinalysis: Recent Labs    01/04/19 1215  COLORURINE AMBER*  LABSPEC 1.012  PHURINE 5.0  GLUCOSEU NEGATIVE  HGBUR LARGE*  BILIRUBINUR NEGATIVE  KETONESUR NEGATIVE  PROTEINUR 30*  NITRITE NEGATIVE  LEUKOCYTESUR LARGE*      Imaging: Ir Fluoro Guide Cv Line Left  Result Date: 01/04/2019 CLINICAL DATA:  Chronic kidney disease stage 4, needs access for hemodialysis EXAM: EXAM RIGHT IJ  CATHETER PLACEMENT UNDER ULTRASOUND AND FLUOROSCOPIC GUIDANCE TECHNIQUE: Patency of the left IJ vein was confirmed with ultrasound with image documentation. An appropriate skin site was determined. Skin site was marked. Region was prepped using maximum barrier technique including cap and mask, sterile gown, sterile  gloves, large sterile sheet, and Chlorhexidine as cutaneous antisepsis. The region was infiltrated locally with 1% lidocaine. Under real-time ultrasound guidance, the left IJ vein was accessed with micropuncture set; the needle tip within the vein was confirmed with ultrasound image documentation. The micropuncture dilator over a guidewire for vascular dilator which allowed advancement of a 20 cm Trialysis catheter. This was positioned with the tip at the cavoatrial junction. Spot chest radiograph shows good positioning and no pneumothorax. Catheter was flushed and sutured externally with 0-Prolene sutures. Patient tolerated the procedure well. FLUOROSCOPY TIME:  0.2 minute; 11 uGym2 DAP COMPLICATIONS: COMPLICATIONS none IMPRESSION: 1. Technically successful left IJ Trialysis catheter placement. Electronically Signed   By: Lucrezia Europe M.D.   On: 01/04/2019 16:39   Ir US Guide Vasc Access Left  Result Date: 01/04/2019 CLINICAL DATA:  Chronic kidney disease stage 4, needs access for hemodialysis EXAM: EXAM RIGHT IJ CATHETER PLACEMENT UNDER ULTRASOUND AND FLUOROSCOPIC GUIDANCE TECHNIQUE: Patency of the left IJ vein was confirmed with ultrasound with image documentation. An appropriate skin site was determined. Skin site was marked. Region was prepped using maximum barrier technique including cap and mask, sterile gown, sterile gloves, large sterile sheet, and Chlorhexidine as cutaneous antisepsis. The region was infiltrated locally with 1% lidocaine. Under real-time ultrasound guidance, the left IJ vein was accessed with micropuncture set; the needle tip within the vein was confirmed with ultrasound image  documentation. The micropuncture dilator over a guidewire for vascular dilator which allowed advancement of a 20 cm Trialysis catheter. This was positioned with the tip at the cavoatrial junction. Spot chest radiograph shows good positioning and no pneumothorax. Catheter was flushed and sutured externally with 0-Prolene sutures. Patient tolerated the procedure well. FLUOROSCOPY TIME:  0.2 minute; 11 uGym2 DAP COMPLICATIONS: COMPLICATIONS none IMPRESSION: 1. Technically successful left IJ Trialysis catheter placement. Electronically Signed   By: Lucrezia Europe M.D.   On: 01/04/2019 16:39     Medications:     lidocaine (PF)  Assessment/ Plan:  78 y.o. female with a PMHx of congestive heart failure, rheumatoid arthritis, chronic kidney disease stage IV, hyperlipidemia, fibromyalgia, giant cell arteritis, hypertension, thoracic ascending aortic aneurysm, duodenal ulcers, severe diverticulosis, coronary artery disease, hypothyroidism, who was admitted to [facility] on 01/01/2019 for ongoing treatment of acute respiratory failure, generalized debility, and severe renal dysfunction.   1.  Acute renal failure/chronic kidney disease stage IV.  Patient due for second Alysis treatment today.  Orders have been prepared.  Remains oliguric at this time.  After today next dialysis treatment will be on Friday.  2.  Acute respiratory failure.  Continue ventilatory support at this time.  Patient may end up with a tracheostomy.  3.  Anemia chronic kidney disease.  Hemoglobin up to 9.7.  Continue to monitor.  4.  Patient continues to have guarded prognosis at this time.   LOS: 0 Stephanie Stark 9/16/202011:18 AM

## 2019-01-06 NOTE — Progress Notes (Signed)
[facility] [place]   PULMONARY CRITICAL CARE SERVICE  PROGRESS NOTE  Date of Service: 01/06/2019  Stephanie Stark  [id number]  DOB: February 23, 1941   DOA: 01/01/2019  Referring Physician: Merton Border, MD  HPI: Stephanie Stark is a 78 y.o. female seen for follow up of Acute on Chronic Respiratory Failure.  Patient is weaning with a 12-hour goal on pressure support today  Medications: Reviewed on Rounds  Physical Exam:  Vitals: Temperature 96.8 pulse 88 respiratory rate 32 blood pressure 119/75 saturations 97%  Ventilator Settings patient is on pressure support FiO2 30% pressure 12/5  . General: Comfortable at this time . Eyes: Grossly normal lids, irises & conjunctiva . ENT: grossly tongue is normal . Neck: no obvious mass . Cardiovascular: S1 S2 normal no gallop . Respiratory: No rhonchi no rales are noted at this time . Abdomen: soft . Skin: no rash seen on limited exam . Musculoskeletal: not rigid . Psychiatric:unable to assess . Neurologic: no seizure no involuntary movements         Lab Data:   Basic Metabolic Panel: Recent Labs  Lab 01/02/19 0557 01/03/19 0629 01/04/19 0807 01/05/19 0643 01/06/19 0617  NA 141 141 140 137 135  K 4.2 4.0 3.9 5.0 4.3  CL 102 101 102 101 98  CO2 26 24 26 22 25   GLUCOSE 92 74 131* 145* 145*  BUN 112* 117* 122* 100* 101*  CREATININE 3.44* 3.64* 3.92* 3.39* 3.57*  CALCIUM 9.7 9.5 9.1 9.1 9.8  MG 1.6* 1.9  --  1.9 2.0  PHOS 4.6 5.3* 4.6 4.2 4.6    ABG: Recent Labs  Lab 01/02/19 0400  PHART 7.343*  PCO2ART 48.2*  PO2ART 78.4*  HCO3 25.5  O2SAT 94.9    Liver Function Tests: Recent Labs  Lab 01/02/19 0557 01/03/19 0629 01/04/19 0807 01/05/19 0643 01/06/19 0617  AST 23  --   --   --   --   ALT 19  --   --   --   --   ALKPHOS 147*  --   --   --   --   BILITOT 1.1  --   --   --   --   PROT 5.6*  --   --   --   --   ALBUMIN 2.3* 2.0* 1.9* 1.9* 2.1*   No results for  input(s): LIPASE, AMYLASE in the last 168 hours. No results for input(s): AMMONIA in the last 168 hours.  CBC: Recent Labs  Lab 01/02/19 0557 01/04/19 0807 01/05/19 0643 01/06/19 0617  WBC 5.9 6.5 8.1 17.2*  NEUTROABS 4.2  --   --   --   HGB 9.3* 8.5* 8.7* 9.7*  HCT 29.5* 27.5* 27.7* 31.9*  MCV 91.0 91.4 92.6 93.0  PLT 156 162 159 276    Cardiac Enzymes: No results for input(s): CKTOTAL, CKMB, CKMBINDEX, TROPONINI in the last 168 hours.  BNP (last 3 results) No results for input(s): BNP in the last 8760 hours.  ProBNP (last 3 results) No results for input(s): PROBNP in the last 8760 hours.  Radiological Exams: Ir Fluoro Guide Cv Line Left  Result Date: 01/04/2019 CLINICAL DATA:  Chronic kidney disease stage 4, needs access for hemodialysis EXAM: EXAM RIGHT IJ CATHETER PLACEMENT UNDER ULTRASOUND AND FLUOROSCOPIC GUIDANCE TECHNIQUE: Patency of the left IJ vein was confirmed with ultrasound with image documentation. An appropriate skin site was determined. Skin site was marked. Region was prepped using maximum barrier technique including cap  and mask, sterile gown, sterile gloves, large sterile sheet, and Chlorhexidine as cutaneous antisepsis. The region was infiltrated locally with 1% lidocaine. Under real-time ultrasound guidance, the left IJ vein was accessed with micropuncture set; the needle tip within the vein was confirmed with ultrasound image documentation. The micropuncture dilator over a guidewire for vascular dilator which allowed advancement of a 20 cm Trialysis catheter. This was positioned with the tip at the cavoatrial junction. Spot chest radiograph shows good positioning and no pneumothorax. Catheter was flushed and sutured externally with 0-Prolene sutures. Patient tolerated the procedure well. FLUOROSCOPY TIME:  0.2 minute; 11 uGym2 DAP COMPLICATIONS: COMPLICATIONS none IMPRESSION: 1. Technically successful left IJ Trialysis catheter placement. Electronically Signed    By: Lucrezia Europe M.D.   On: 01/04/2019 16:39   Ir US Guide Vasc Access Left  Result Date: 01/04/2019 CLINICAL DATA:  Chronic kidney disease stage 4, needs access for hemodialysis EXAM: EXAM RIGHT IJ CATHETER PLACEMENT UNDER ULTRASOUND AND FLUOROSCOPIC GUIDANCE TECHNIQUE: Patency of the left IJ vein was confirmed with ultrasound with image documentation. An appropriate skin site was determined. Skin site was marked. Region was prepped using maximum barrier technique including cap and mask, sterile gown, sterile gloves, large sterile sheet, and Chlorhexidine as cutaneous antisepsis. The region was infiltrated locally with 1% lidocaine. Under real-time ultrasound guidance, the left IJ vein was accessed with micropuncture set; the needle tip within the vein was confirmed with ultrasound image documentation. The micropuncture dilator over a guidewire for vascular dilator which allowed advancement of a 20 cm Trialysis catheter. This was positioned with the tip at the cavoatrial junction. Spot chest radiograph shows good positioning and no pneumothorax. Catheter was flushed and sutured externally with 0-Prolene sutures. Patient tolerated the procedure well. FLUOROSCOPY TIME:  0.2 minute; 11 uGym2 DAP COMPLICATIONS: COMPLICATIONS none IMPRESSION: 1. Technically successful left IJ Trialysis catheter placement. Electronically Signed   By: Lucrezia Europe M.D.   On: 01/04/2019 16:39    Assessment/Plan Active Problems:   Acute on chronic respiratory failure with hypoxia (HCC)   Chronic kidney disease, stage IV (severe) (HCC)   Acute lower GI bleeding   Bacterial lobar pneumonia   1. Acute on chronic respiratory failure with hypoxia we will continue with the wean protocol today the goal is for 12 hours. 2. Chronic kidney disease stage IV at baseline we will continue with present management. 3. Acute lower GI bleed no sign of active bleeding 4. Bacterial pneumonia treated we will continue to follow-up  radiologically   I have personally seen and evaluated the patient, evaluated laboratory and imaging results, formulated the assessment and plan and placed orders. The Patient requires high complexity decision making for assessment and support.  Case was discussed on Rounds with the Respiratory Therapy Staff  Allyne Gee, MD [facility] Pulmonary Critical Care Medicine Sleep Medicine

## 2019-01-07 DIAGNOSIS — J159 Unspecified bacterial pneumonia: Secondary | ICD-10-CM | POA: Diagnosis not present

## 2019-01-07 DIAGNOSIS — K922 Gastrointestinal hemorrhage, unspecified: Secondary | ICD-10-CM | POA: Diagnosis not present

## 2019-01-07 DIAGNOSIS — N184 Chronic kidney disease, stage 4 (severe): Secondary | ICD-10-CM | POA: Diagnosis not present

## 2019-01-07 DIAGNOSIS — J9621 Acute and chronic respiratory failure with hypoxia: Secondary | ICD-10-CM | POA: Diagnosis not present

## 2019-01-07 NOTE — Progress Notes (Addendum)
Pulmonary [place]   PULMONARY CRITICAL CARE SERVICE  PROGRESS NOTE  Date of Service: 01/07/2019  Stephanie Stark  [name]  DOB: [birth year]   DOA: 01/01/2019  Referring Physician: Merton Border, MD  HPI: Stephanie Stark is a 78 y.o. female seen for follow up of Acute on Chronic Respiratory Failure.  Patient remains on pressure support 12/5 and FiO2 30% for 16-hour goal today ENT consult was placed for trach evaluation.  Medications: Reviewed on Rounds  Physical Exam:  Vitals: Pulse 93 respirations 15 BP 136/70 O2 sat 92% 74.1  Ventilator Settings pressure 12/5 FiO2 30%  . General: Comfortable at this time . Eyes: Grossly normal lids, irises & conjunctiva . ENT: grossly tongue is normal . Neck: no obvious mass . Cardiovascular: S1 S2 normal no gallop . Respiratory: No rales or rhonchi noted . Abdomen: soft . Skin: no rash seen on limited exam . Musculoskeletal: not rigid . Psychiatric:unable to assess . Neurologic: no seizure no involuntary movements         Lab Data:   Basic Metabolic Panel: Recent Labs  Lab 01/02/19 0557 01/03/19 0629 01/04/19 0807 01/05/19 0643 01/06/19 0617  NA 141 141 140 137 135  K 4.2 4.0 3.9 5.0 4.3  CL 102 101 102 101 98  CO2 26 24 26 22 25   GLUCOSE 92 74 131* 145* 145*  BUN 112* 117* 122* 100* 101*  CREATININE 3.44* 3.64* 3.92* 3.39* 3.57*  CALCIUM 9.7 9.5 9.1 9.1 9.8  MG 1.6* 1.9  --  1.9 2.0  PHOS 4.6 5.3* 4.6 4.2 4.6    ABG: Recent Labs  Lab 01/02/19 0400  PHART 7.343*  PCO2ART 48.2*  PO2ART 78.4*  HCO3 25.5  O2SAT 94.9    Liver Function Tests: Recent Labs  Lab 01/02/19 0557 01/03/19 0629 01/04/19 0807 01/05/19 0643 01/06/19 0617  AST 23  --   --   --   --   ALT 19  --   --   --   --   ALKPHOS 147*  --   --   --   --   BILITOT 1.1  --   --   --   --   PROT 5.6*  --   --   --   --   ALBUMIN 2.3* 2.0* 1.9* 1.9* 2.1*   No results for input(s): LIPASE, AMYLASE in  the last 168 hours. No results for input(s): AMMONIA in the last 168 hours.  CBC: Recent Labs  Lab 01/02/19 0557 01/04/19 0807 01/05/19 0643 01/06/19 0617  WBC 5.9 6.5 8.1 17.2*  NEUTROABS 4.2  --   --   --   HGB 9.3* 8.5* 8.7* 9.7*  HCT 29.5* 27.5* 27.7* 31.9*  MCV 91.0 91.4 92.6 93.0  PLT 156 162 159 276    Cardiac Enzymes: No results for input(s): CKTOTAL, CKMB, CKMBINDEX, TROPONINI in the last 168 hours.  BNP (last 3 results) No results for input(s): BNP in the last 8760 hours.  ProBNP (last 3 results) No results for input(s): PROBNP in the last 8760 hours.  Radiological Exams: No results found.  Assessment/Plan Active Problems:   Acute on chronic respiratory failure with hypoxia (HCC)   Chronic kidney disease, stage IV (severe) (HCC)   Acute lower GI bleeding   Bacterial lobar pneumonia   1. Acute on chronic respiratory failure with hypoxia patient is a 16-hour goal on pressure support 12/5 and FiO2 of 30% we will continue supportive measures and pulmonary toilet.  2. Chronic kidney disease stage IV at baseline we will continue with present management. 3. Acute lower GI bleed no sign of active bleeding 4. Bacterial pneumonia treated we will continue to follow-up radiologically   I have personally seen and evaluated the patient, evaluated laboratory and imaging results, formulated the assessment and plan and placed orders. The Patient requires high complexity decision making for assessment and support.  Case was discussed on Rounds with the Respiratory Therapy Staff  Allyne Gee, MD [facility] Pulmonary Critical Care Medicine Sleep Medicine

## 2019-01-08 DIAGNOSIS — N184 Chronic kidney disease, stage 4 (severe): Secondary | ICD-10-CM | POA: Diagnosis not present

## 2019-01-08 DIAGNOSIS — J159 Unspecified bacterial pneumonia: Secondary | ICD-10-CM | POA: Diagnosis not present

## 2019-01-08 DIAGNOSIS — K922 Gastrointestinal hemorrhage, unspecified: Secondary | ICD-10-CM | POA: Diagnosis not present

## 2019-01-08 DIAGNOSIS — J9621 Acute and chronic respiratory failure with hypoxia: Secondary | ICD-10-CM | POA: Diagnosis not present

## 2019-01-08 LAB — CBC
HCT: 26 % — ABNORMAL LOW (ref 36.0–46.0)
Hemoglobin: 8.1 g/dL — ABNORMAL LOW (ref 12.0–15.0)
MCH: 28.6 pg (ref 26.0–34.0)
MCHC: 31.2 g/dL (ref 30.0–36.0)
MCV: 91.9 fL (ref 80.0–100.0)
Platelets: 185 10*3/uL (ref 150–400)
RBC: 2.83 MIL/uL — ABNORMAL LOW (ref 3.87–5.11)
RDW: 15.9 % — ABNORMAL HIGH (ref 11.5–15.5)
WBC: 9.2 10*3/uL (ref 4.0–10.5)
nRBC: 0.4 % — ABNORMAL HIGH (ref 0.0–0.2)

## 2019-01-08 LAB — RENAL FUNCTION PANEL
Albumin: 1.9 g/dL — ABNORMAL LOW (ref 3.5–5.0)
Anion gap: 13 (ref 5–15)
BUN: 71 mg/dL — ABNORMAL HIGH (ref 8–23)
CO2: 23 mmol/L (ref 22–32)
Calcium: 8.9 mg/dL (ref 8.9–10.3)
Chloride: 93 mmol/L — ABNORMAL LOW (ref 98–111)
Creatinine, Ser: 3.01 mg/dL — ABNORMAL HIGH (ref 0.44–1.00)
GFR calc Af Amer: 16 mL/min — ABNORMAL LOW (ref >60)
GFR calc non Af Amer: 14 mL/min — ABNORMAL LOW (ref >60)
Glucose, Bld: 112 mg/dL — ABNORMAL HIGH (ref 70–99)
Phosphorus: 1.9 mg/dL — ABNORMAL LOW (ref 2.5–4.6)
Potassium: 3.8 mmol/L (ref 3.5–5.1)
Sodium: 129 mmol/L — ABNORMAL LOW (ref 135–145)

## 2019-01-08 NOTE — Progress Notes (Signed)
[facility]  ROUNDING NOTE   Subjective:  Patient seen at bedside. Remains critically ill currently. Still on the ventilator.   Objective:  Vital signs in last 24 hours:  Temperature 97 pulse 87 respirations 21 blood pressure 109/65   Physical Exam: General: Critically ill-appearing  Head: Endotracheal tube noted to be in place NG inplace  Eyes: Anicteric  Neck: Supple, trachea midline  Lungs:  Scattered rhonchi rales, vent assisted  Heart: S1S2 no rubs  Abdomen:  Soft, mild distension  Extremities: 3+ peripheral edema.  Neurologic: Arousable but not following commands  Skin: Weeping skin from both upper extremities  Access: Left IJ temporary dialysis catheter    Basic Metabolic Panel: Recent Labs  Lab 01/02/19 0557 01/03/19 0629 01/04/19 0807 01/05/19 0643 01/06/19 0617  NA 141 141 140 137 135  K 4.2 4.0 3.9 5.0 4.3  CL 102 101 102 101 98  CO2 26 24 26 22 25   GLUCOSE 92 74 131* 145* 145*  BUN 112* 117* 122* 100* 101*  CREATININE 3.44* 3.64* 3.92* 3.39* 3.57*  CALCIUM 9.7 9.5 9.1 9.1 9.8  MG 1.6* 1.9  --  1.9 2.0  PHOS 4.6 5.3* 4.6 4.2 4.6    Liver Function Tests: Recent Labs  Lab 01/02/19 0557 01/03/19 0629 01/04/19 0807 01/05/19 0643 01/06/19 0617  AST 23  --   --   --   --   ALT 19  --   --   --   --   ALKPHOS 147*  --   --   --   --   BILITOT 1.1  --   --   --   --   PROT 5.6*  --   --   --   --   ALBUMIN 2.3* 2.0* 1.9* 1.9* 2.1*   No results for input(s): LIPASE, AMYLASE in the last 168 hours. No results for input(s): AMMONIA in the last 168 hours.  CBC: Recent Labs  Lab 01/02/19 0557 01/04/19 0807 01/05/19 0643 01/06/19 0617  WBC 5.9 6.5 8.1 17.2*  NEUTROABS 4.2  --   --   --   HGB 9.3* 8.5* 8.7* 9.7*  HCT 29.5* 27.5* 27.7* 31.9*  MCV 91.0 91.4 92.6 93.0  PLT 156 162 159 276    Cardiac Enzymes: No results for input(s): CKTOTAL, CKMB, CKMBINDEX, TROPONINI in the last 168 hours.  BNP: Invalid input(s):  POCBNP  CBG: No results for input(s): GLUCAP in the last 168 hours.  Microbiology: Results for orders placed or performed during the hospital encounter of 01/01/19  Culture, Urine     Status: Abnormal   Collection Time: 01/04/19 12:15 PM   Specimen: Urine, Random  Result Value Ref Range Status   Specimen Description URINE, RANDOM  Final   Special Requests   Final    NONE Performed at [facility], [address], [place], [place] [address]    Culture >=100,000 COLONIES/mL YEAST (A)  Final   Report Status 01/05/2019 FINAL  Final  C difficile quick scan w PCR reflex     Status: Abnormal   Collection Time: 01/04/19 12:15 PM   Specimen: STOOL  Result Value Ref Range Status   C Diff antigen POSITIVE (A) NEGATIVE Final   C Diff toxin NEGATIVE NEGATIVE Final   C Diff interpretation Results are indeterminate. See PCR results.  Final    Comment: Performed at [facility], [place] [address] [place], [place] [address]  C. Diff by PCR, Reflexed  Status: Abnormal   Collection Time: 01/04/19 12:15 PM  Result Value Ref Range Status   Toxigenic C. Difficile by PCR POSITIVE (A) NEGATIVE Final    Comment: Positive for toxigenic C. difficile with little to no toxin production. Only treat if clinical presentation suggests symptomatic illness. Performed at [facility], [place] [address] [place], [place] [address]   Culture, blood (routine x 2)     Status: None (Preliminary result)   Collection Time: 01/04/19 12:40 PM   Specimen: BLOOD  Result Value Ref Range Status   Specimen Description BLOOD RIGHT ANTECUBITAL  Final   Special Requests   Final    BOTTLES DRAWN AEROBIC ONLY Blood Culture results may not be optimal due to an inadequate volume of blood received in culture bottles   Culture   Final    NO GROWTH 3 DAYS Performed at [facility], [place] [address], [place], [place] [address]    Report Status PENDING  Incomplete  Culture, blood (routine x 2)     Status: None  (Preliminary result)   Collection Time: 01/04/19 12:45 PM   Specimen: BLOOD RIGHT HAND  Result Value Ref Range Status   Specimen Description BLOOD RIGHT HAND  Final   Special Requests   Final    BOTTLES DRAWN AEROBIC ONLY Blood Culture adequate volume   Culture   Final    NO GROWTH 3 DAYS Performed at [facility], [place] [address], [place], [place] [address]    Report Status PENDING  Incomplete  Culture, respiratory     Status: None   Collection Time: 01/04/19  2:27 PM   Specimen: Tracheal Aspirate  Result Value Ref Range Status   Specimen Description TRACHEAL ASPIRATE  Final   Special Requests NONE  Final   Gram Stain   Final    ABUNDANT WBC PRESENT, PREDOMINANTLY PMN RARE SQUAMOUS EPITHELIAL CELLS PRESENT NO ORGANISMS SEEN Performed at [facility], [place] [address], [place], [place] [address]    Culture FEW CITROBACTER FREUNDII  Final   Report Status 01/06/2019 FINAL  Final   Organism ID, Bacteria CITROBACTER FREUNDII  Final      Susceptibility   Citrobacter freundii - MIC*    CEFAZOLIN >=64 RESISTANT Resistant     CEFEPIME <=1 SENSITIVE Sensitive     CEFTAZIDIME >=64 RESISTANT Resistant     CEFTRIAXONE >=64 RESISTANT Resistant     CIPROFLOXACIN <=0.25 SENSITIVE Sensitive     GENTAMICIN <=1 SENSITIVE Sensitive     IMIPENEM <=0.25 SENSITIVE Sensitive     TRIMETH/SULFA <=20 SENSITIVE Sensitive     PIP/TAZO 64 INTERMEDIATE Intermediate     * FEW CITROBACTER FREUNDII    Coagulation Studies: No results for input(s): LABPROT, INR in the last 72 hours.  Urinalysis: No results for input(s): COLORURINE, LABSPEC, PHURINE, GLUCOSEU, HGBUR, BILIRUBINUR, KETONESUR, PROTEINUR, UROBILINOGEN, NITRITE, LEUKOCYTESUR in the last 72 hours.  Invalid input(s): APPERANCEUR    Imaging: No results found.   Medications:     lidocaine (PF)  Assessment/ Plan:  78 y.o. female with a PMHx of congestive heart failure, rheumatoid arthritis, chronic kidney disease stage IV,  hyperlipidemia, fibromyalgia, giant cell arteritis, hypertension, thoracic ascending aortic aneurysm, duodenal ulcers, severe diverticulosis, coronary artery disease, hypothyroidism, who was admitted to [facility] on 01/01/2019 for ongoing treatment of acute respiratory failure, generalized debility, and severe renal dysfunction.   1.  Acute renal failure/chronic kidney disease stage IV.  Patient remains dialysis dependent at the moment.  BUN currently  101 with a creatinine of 3.57 at last check.  Continue to monitor renal parameters.  We do plan for dialysis today as well as on Monday.  2.  Acute respiratory failure.  Patient remains on the ventilator at this time.  Continue current ventilatory support.  3.  Anemia chronic kidney disease.  Recheck CBC today.  Most recent hemoglobin was 9.7.    LOS: 0 Kanyla Omeara 9/18/20207:58 AM

## 2019-01-08 NOTE — Progress Notes (Addendum)
Pulmonary [place]   PULMONARY CRITICAL CARE SERVICE  PROGRESS NOTE  Date of Service: 01/08/2019  Stephanie Stark  [id number]  DOB: June 18, 1940   DOA: 01/01/2019  Referring Physician: Merton Border, MD  HPI: Stephanie Stark is a 78 y.o. female seen for follow up of Acute on Chronic Respiratory Failure.  Patient remains on pressure support 12/5 and FiO2 of 35% for 16-hour goal today.  Medications: Reviewed on Rounds  Physical Exam:  Vitals: Pulse 87 respirations 21 BP 109/65 O2 sat 94% temp 97.8  Ventilator Settings pressure support 12/5 FiO2 35%  . General: Comfortable at this time . Eyes: Grossly normal lids, irises & conjunctiva . ENT: grossly tongue is normal . Neck: no obvious mass . Cardiovascular: S1 S2 normal no gallop . Respiratory: No rales or rhonchi noted . Abdomen: soft . Skin: no rash seen on limited exam . Musculoskeletal: not rigid . Psychiatric:unable to assess . Neurologic: no seizure no involuntary movements         Lab Data:   Basic Metabolic Panel: Recent Labs  Lab 01/02/19 0557 01/03/19 0629 01/04/19 0807 01/05/19 0643 01/06/19 0617 01/08/19 0720  NA 141 141 140 137 135 129*  K 4.2 4.0 3.9 5.0 4.3 3.8  CL 102 101 102 101 98 93*  CO2 26 24 26 22 25 23   GLUCOSE 92 74 131* 145* 145* 112*  BUN 112* 117* 122* 100* 101* 71*  CREATININE 3.44* 3.64* 3.92* 3.39* 3.57* 3.01*  CALCIUM 9.7 9.5 9.1 9.1 9.8 8.9  MG 1.6* 1.9  --  1.9 2.0  --   PHOS 4.6 5.3* 4.6 4.2 4.6 1.9*    ABG: Recent Labs  Lab 01/02/19 0400  PHART 7.343*  PCO2ART 48.2*  PO2ART 78.4*  HCO3 25.5  O2SAT 94.9    Liver Function Tests: Recent Labs  Lab 01/02/19 0557 01/03/19 0629 01/04/19 0807 01/05/19 0643 01/06/19 0617 01/08/19 0720  AST 23  --   --   --   --   --   ALT 19  --   --   --   --   --   ALKPHOS 147*  --   --   --   --   --   BILITOT 1.1  --   --   --   --   --   PROT 5.6*  --   --   --   --   --   ALBUMIN  2.3* 2.0* 1.9* 1.9* 2.1* 1.9*   No results for input(s): LIPASE, AMYLASE in the last 168 hours. No results for input(s): AMMONIA in the last 168 hours.  CBC: Recent Labs  Lab 01/02/19 0557 01/04/19 0807 01/05/19 0643 01/06/19 0617 01/08/19 0720  WBC 5.9 6.5 8.1 17.2* 9.2  NEUTROABS 4.2  --   --   --   --   HGB 9.3* 8.5* 8.7* 9.7* 8.1*  HCT 29.5* 27.5* 27.7* 31.9* 26.0*  MCV 91.0 91.4 92.6 93.0 91.9  PLT 156 162 159 276 185    Cardiac Enzymes: No results for input(s): CKTOTAL, CKMB, CKMBINDEX, TROPONINI in the last 168 hours.  BNP (last 3 results) No results for input(s): BNP in the last 8760 hours.  ProBNP (last 3 results) No results for input(s): PROBNP in the last 8760 hours.  Radiological Exams: No results found.  Assessment/Plan Active Problems:   Acute on chronic respiratory failure with hypoxia (HCC)   Chronic kidney disease, stage IV (severe) (HCC)   Acute lower  GI bleeding   Bacterial lobar pneumonia   1. Acute on chronic respiratory failure with hypoxia patient is a 16-hour goal on pressure support 12/5 and FiO2 of 35% we will continue supportive measures and pulmonary toilet. 2. Chronic kidney disease stage IV at baseline we will continue with present management. 3. Acute lower GI bleed no sign of active bleeding 4. Bacterial pneumonia treated we will continue to follow-up radiologically   I have personally seen and evaluated the patient, evaluated laboratory and imaging results, formulated the assessment and plan and placed orders. The Patient requires high complexity decision making for assessment and support.  Case was discussed on Rounds with the Respiratory Therapy Staff  Allyne Gee, MD [facility] Pulmonary Critical Care Medicine Sleep Medicine

## 2019-01-08 NOTE — Consult Note (Signed)
Medical Consultation   Stephanie Stark  [id number]  DOB: 07-07-1940  DOA: 01/01/2019  PCP: Kennieth Rad, MD   Requesting physician: Dr. Laren Everts  Reason for consultation: Antibiotic recommendations   History of Present Illness: Patient currently intubated.  Unable to obtain any history at this time.  Therefore history obtained from the medical records. Stephanie Stark is an 78 y.o. female who was [facility].  She has a past medical history of hypertension, hyperlipidemia, hypothyroidism, congestive heart failure with preserved ejection fraction, unspecified coronary artery disease, rheumatoid arthritis who was admitted to the outside hospital with chief complaints of bright lead blood per rectum and hematemesis.  She was recently discharged from another hospital for upper and lower GI bleed and received multiple blood transfusions prior to this admission.  During that admission patient underwent EGD and colonoscopy which showed anterior duodenal bulb ulcer with visible vessel.  Colonoscopy on 11/27/2018 demonstrated moderate diverticulosis through the entire colon with severe diverticulosis in the sigmoid colon.  Patient was treated with Protonix twice daily and soft diet.  During that admission she received multiple PRBCs transfusion for low hemoglobin.  Patient was also intubated and started on TPN.  She was noted to have pancytopenia with suspicious DIC.  Oncology was consulted.  Nephrology was also consulted due to acute kidney injury and the patient continued to have a low urine output.  She was started on Lasix drip with minimal improvement of urine production.  Patient hospital stay was complicated by pneumonia.  She was initially started on IV vancomycin, Zosyn.  Sputum cultures showed MRSA.  Zosyn was discontinued with continuous antibiotic coverage with vancomycin.  She continued to have low urine output, Lasix drip increased by nephrology.  After multiple  attempts of spontaneous breathing trial patient was extubated.  However, had to be reintubated due to respiratory distress.  [facility] and care.  Patient currently is orally intubated, not following any commands at this time.  She has abdominal distention and has candidemia for growing.  She has been on treatment with ciprofloxacin for ventilator associated pneumonia with Citrobacter.  She also was found to have stool C. difficile positive therefore started on p.o. vancomycin.   Review of Systems:  Patient currently intubated.  Unable to obtain review of systems from the patient.   Past Medical History: Past Medical History:  Diagnosis Date   Acute lower GI bleeding    Acute on chronic respiratory failure with hypoxia (HCC)    Bacterial lobar pneumonia    Chronic kidney disease, stage IV (severe) (HCC)   Duodenal ulcers, severe diverticulosis, coronary artery disease, grade 1 diastolic congestive heart failure with preserved ejection fraction, hypertension, hyperlipidemia, hypothyroidism, venous insufficiency, rheumatoid arthritis, depression.  Allergies: Cephalexin  Social History:  has no history on file for tobacco, alcohol, and drug.   Family History: Congestive heart failure in father, celiac disease, osteoporosis, stable angina, coronary artery disease in mother  Past surgical history: Cholecystectomy, left knee surgery, tubal ligation, hysterectomy, unspecified right hip surgery, bilateral breast reduction.      Physical Exam: Vitals: Temperature 96.8, heart rate 88, respiratory rate 32, blood pressure 119/75, oxygen saturation 97%  Constitutional: Ill-appearing female, orally intubated Eyes: PERLA  ENMT: Orally intubated Neck: No masses felt CVS: S1-S2.  No murmur.  Bilateral lower extremity edema Respiratory: Rhonchi, no wheezing Abdomen: Distended, positive bowel sounds, has  cutaneous candidiasis of the  groin area Neuro: Patient not following any commands at this time.  Unable to do a neurologic exam.    Labs:  CBC: Recent Labs  Lab 01/02/19 0557 01/04/19 0807 01/05/19 0643 01/06/19 0617 01/08/19 0720  WBC 5.9 6.5 8.1 17.2* 9.2  NEUTROABS 4.2  --   --   --   --   HGB 9.3* 8.5* 8.7* 9.7* 8.1*  HCT 29.5* 27.5* 27.7* 31.9* 26.0*  MCV 91.0 91.4 92.6 93.0 91.9  PLT 156 162 159 276 [id number]    Basic Metabolic Panel: Recent Labs  Lab 01/02/19 0557 01/03/19 0629 01/04/19 0807 01/05/19 0643 01/06/19 0617 01/08/19 0720  NA 141 141 140 137 135 129*  K 4.2 4.0 3.9 5.0 4.3 3.8  CL 102 101 102 101 98 93*  CO2 26 24 26 22 25 23   GLUCOSE 92 74 131* 145* 145* 112*  BUN 112* 117* 122* 100* 101* 71*  CREATININE 3.44* 3.64* 3.92* 3.39* 3.57* 3.01*  CALCIUM 9.7 9.5 9.1 9.1 9.8 8.9  MG 1.6* 1.9  --  1.9 2.0  --   PHOS 4.6 5.3* 4.6 4.2 4.6 1.9*   GFR CrCl cannot be calculated (Unknown ideal weight.). Liver Function Tests: Recent Labs  Lab 01/02/19 0557 01/03/19 0629 01/04/19 0807 01/05/19 0643 01/06/19 0617 01/08/19 0720  AST 23  --   --   --   --   --   ALT 19  --   --   --   --   --   ALKPHOS 147*  --   --   --   --   --   BILITOT 1.1  --   --   --   --   --   PROT 5.6*  --   --   --   --   --   ALBUMIN 2.3* 2.0* 1.9* 1.9* 2.1* 1.9*   No results for input(s): LIPASE, AMYLASE in the last 168 hours. No results for input(s): AMMONIA in the last 168 hours. Coagulation profile Recent Labs  Lab 01/02/19 0557  INR 1.2    Cardiac Enzymes: No results for input(s): CKTOTAL, CKMB, CKMBINDEX, TROPONINI in the last 168 hours. BNP: Invalid input(s): POCBNP CBG: No results for input(s): GLUCAP in the last 168 hours. D-Dimer No results for input(s): DDIMER in the last 72 hours. Hgb A1c No results for input(s): HGBA1C in the last 72 hours. Lipid Profile No results for input(s): CHOL, HDL, LDLCALC, TRIG, CHOLHDL, LDLDIRECT in the last 72 hours. Thyroid function studies No results  for input(s): TSH, T4TOTAL, T3FREE, THYROIDAB in the last 72 hours.  Invalid input(s): FREET3 Anemia work up No results for input(s): VITAMINB12, FOLATE, FERRITIN, TIBC, IRON, RETICCTPCT in the last 72 hours. Urinalysis    Component Value Date/Time   COLORURINE AMBER (A) 01/04/2019 1215   APPEARANCEUR CLOUDY (A) 01/04/2019 1215   LABSPEC 1.012 01/04/2019 1215   PHURINE 5.0 01/04/2019 1215   GLUCOSEU NEGATIVE 01/04/2019 1215   HGBUR LARGE (A) 01/04/2019 1215   BILIRUBINUR NEGATIVE 01/04/2019 1215   KETONESUR NEGATIVE 01/04/2019 1215   PROTEINUR 30 (A) 01/04/2019 1215   NITRITE NEGATIVE 01/04/2019 1215   LEUKOCYTESUR LARGE (A) 01/04/2019 1215     Microbiology Recent Results (from the past 240 hour(s))  Culture, Urine     Status: Abnormal   Collection Time: 01/04/19 12:15 PM   Specimen: Urine, Random  Result Value Ref Range Status   Specimen Description URINE, RANDOM  Final   Special  Requests   Final    NONE Performed at [facility], [place] [address], [place], [place] [address]    Culture >=100,000 COLONIES/mL YEAST (A)  Final   Report Status 01/05/2019 FINAL  Final  C difficile quick scan w PCR reflex     Status: Abnormal   Collection Time: 01/04/19 12:15 PM   Specimen: STOOL  Result Value Ref Range Status   C Diff antigen POSITIVE (A) NEGATIVE Final   C Diff toxin NEGATIVE NEGATIVE Final   C Diff interpretation Results are indeterminate. See PCR results.  Final    Comment: Performed at [facility], [place] [address] [place], [place] [address]  C. Diff by PCR, Reflexed     Status: Abnormal   Collection Time: 01/04/19 12:15 PM  Result Value Ref Range Status   Toxigenic C. Difficile by PCR POSITIVE (A) NEGATIVE Final    Comment: Positive for toxigenic C. difficile with little to no toxin production. Only treat if clinical presentation suggests symptomatic illness. Performed at [facility], [place] [address], [place], [place] [address]   Culture, blood  (routine x 2)     Status: None (Preliminary result)   Collection Time: 01/04/19 12:40 PM   Specimen: BLOOD  Result Value Ref Range Status   Specimen Description BLOOD RIGHT ANTECUBITAL  Final   Special Requests   Final    BOTTLES DRAWN AEROBIC ONLY Blood Culture results may not be optimal due to an inadequate volume of blood received in culture bottles   Culture   Final    NO GROWTH 4 DAYS Performed at [facility], [place] [address], [place], [place] [address]    Report Status PENDING  Incomplete  Culture, blood (routine x 2)     Status: None (Preliminary result)   Collection Time: 01/04/19 12:45 PM   Specimen: BLOOD RIGHT HAND  Result Value Ref Range Status   Specimen Description BLOOD RIGHT HAND  Final   Special Requests   Final    BOTTLES DRAWN AEROBIC ONLY Blood Culture adequate volume   Culture   Final    NO GROWTH 4 DAYS Performed at [facility], [place] [address], [place], [place] [address]    Report Status PENDING  Incomplete  Culture, respiratory     Status: None   Collection Time: 01/04/19  2:27 PM   Specimen: Tracheal Aspirate  Result Value Ref Range Status   Specimen Description TRACHEAL ASPIRATE  Final   Special Requests NONE  Final   Gram Stain   Final    ABUNDANT WBC PRESENT, PREDOMINANTLY PMN RARE SQUAMOUS EPITHELIAL CELLS PRESENT NO ORGANISMS SEEN Performed at [facility], [place] [address], [place], [place] [address]    Culture FEW CITROBACTER FREUNDII  Final   Report Status 01/06/2019 FINAL  Final   Organism ID, Bacteria CITROBACTER FREUNDII  Final      Susceptibility   Citrobacter freundii - MIC*    CEFAZOLIN >=64 RESISTANT Resistant     CEFEPIME <=1 SENSITIVE Sensitive     CEFTAZIDIME >=64 RESISTANT Resistant     CEFTRIAXONE >=64 RESISTANT Resistant     CIPROFLOXACIN <=0.25 SENSITIVE Sensitive     GENTAMICIN <=1 SENSITIVE Sensitive     IMIPENEM <=0.25 SENSITIVE Sensitive     TRIMETH/SULFA <=20 SENSITIVE Sensitive     PIP/TAZO 64  INTERMEDIATE Intermediate     * FEW CITROBACTER FREUNDII       Inpatient Medications:   Please see  MAR    Radiological Exams on Admission: No results found.  Impression/Recommendations Active Problems:   Acute on chronic respiratory failure with hypoxia (HCC)   Chronic kidney disease, stage IV (severe) (HCC)   Acute lower GI bleeding   Bacterial lobar pneumonia C. difficile colitis Severe cutaneous candidiasis GI bleed Urinary tract infection with yeast Severe protein calorie malnutrition Dysphagia  Acute on chronic respiratory failure with hypoxemia: Ventilator dependent.  Last chest x-ray done on 01/02/2019 per report concern for severe multilobar pneumonia.  Respiratory cultures showing Citrobacter.  On treatment with ciprofloxacin.  Would recommend to treat for duration of 7 -10 days pending improvement.  Pulmonary following.  Acute on chronic renal failure: Patient likely has stage IV CKD.  Currently on dialysis per nephrology.  Antibiotics renally dosed.  C. difficile colitis: Continue p.o. vancomycin.  Would recommend to continue treatment while on the ciprofloxacin and also for 1 week after stopping the ciprofloxacin.  Severe cutaneous candidiasis: Continue treatment with fluconazole.  Plan to treat for 5 days.  Also recommend topical antifungal.  GI bleed: Management per the primary team.  UTI: Urine culture showing yeast.  Fluconazole as mentioned above.  Dysphagia: Due to her dysphagia she is very high risk for aspiration and worsening respiratory failure from aspiration.  Due to her complex medical problems she is a very high risk for worsening anticoagulation.  Thank you for this consultation.     Yaakov Guthrie M.D.  01/07/2019, 7:49 PM

## 2019-01-09 LAB — CULTURE, BLOOD (ROUTINE X 2)
Culture: NO GROWTH
Culture: NO GROWTH
Special Requests: ADEQUATE

## 2019-01-10 ENCOUNTER — Other Ambulatory Visit (HOSPITAL_COMMUNITY): Payer: Medicare Other

## 2019-01-10 DIAGNOSIS — J159 Unspecified bacterial pneumonia: Secondary | ICD-10-CM | POA: Diagnosis not present

## 2019-01-10 DIAGNOSIS — N184 Chronic kidney disease, stage 4 (severe): Secondary | ICD-10-CM | POA: Diagnosis not present

## 2019-01-10 DIAGNOSIS — J9621 Acute and chronic respiratory failure with hypoxia: Secondary | ICD-10-CM | POA: Diagnosis not present

## 2019-01-10 DIAGNOSIS — K922 Gastrointestinal hemorrhage, unspecified: Secondary | ICD-10-CM | POA: Diagnosis not present

## 2019-01-10 NOTE — Progress Notes (Signed)
Pulmonary [place]   PULMONARY CRITICAL CARE SERVICE  PROGRESS NOTE  Date of Service: 01/10/2019  Stephanie Stark  [name]  DOB: 06/11/1940   DOA: 01/01/2019  Referring Physician: Merton Border, MD  HPI: Stephanie Stark is a 78 y.o. female seen for follow up of Acute on Chronic Respiratory Failure.  Patient at this time is comfortable without distress patient has been on pressure support mode has been on 35% FiO2.  Was able to do approximately 8 hours on a pressure support of 12/5  Medications: Reviewed on Rounds  Physical Exam:  Vitals: Temperature 95.8 pulse 85 respiratory rate 19 blood pressure 126/44 saturations are 100%  Ventilator Settings mode ventilation pressure support FiO2 35% pressure support 12 PEEP 5  . General: Comfortable at this time . Eyes: Grossly normal lids, irises & conjunctiva . ENT: grossly tongue is normal . Neck: no obvious mass . Cardiovascular: S1 S2 normal no gallop . Respiratory: No rhonchi no rales are noted at this time. . Abdomen: soft . Skin: no rash seen on limited exam . Musculoskeletal: not rigid . Psychiatric:unable to assess . Neurologic: no seizure no involuntary movements         Lab Data:   Basic Metabolic Panel: Recent Labs  Lab 01/04/19 0807 01/05/19 0643 01/06/19 0617 01/08/19 0720  NA 140 137 135 129*  K 3.9 5.0 4.3 3.8  CL 102 101 98 93*  CO2 26 22 25 23   GLUCOSE 131* 145* 145* 112*  BUN 122* 100* 101* 71*  CREATININE 3.92* 3.39* 3.57* 3.01*  CALCIUM 9.1 9.1 9.8 8.9  MG  --  1.9 2.0  --   PHOS 4.6 4.2 4.6 1.9*    ABG: No results for input(s): PHART, PCO2ART, PO2ART, HCO3, O2SAT in the last 168 hours.  Liver Function Tests: Recent Labs  Lab 01/04/19 0807 01/05/19 0643 01/06/19 0617 01/08/19 0720  ALBUMIN 1.9* 1.9* 2.1* 1.9*   No results for input(s): LIPASE, AMYLASE in the last 168 hours. No results for input(s): AMMONIA in the last 168  hours.  CBC: Recent Labs  Lab 01/04/19 0807 01/05/19 0643 01/06/19 0617 01/08/19 0720  WBC 6.5 8.1 17.2* 9.2  HGB 8.5* 8.7* 9.7* 8.1*  HCT 27.5* 27.7* 31.9* 26.0*  MCV 91.4 92.6 93.0 91.9  PLT 162 159 276 185    Cardiac Enzymes: No results for input(s): CKTOTAL, CKMB, CKMBINDEX, TROPONINI in the last 168 hours.  BNP (last 3 results) No results for input(s): BNP in the last 8760 hours.  ProBNP (last 3 results) No results for input(s): PROBNP in the last 8760 hours.  Radiological Exams: Dg Abd 1 View  Result Date: 01/10/2019 CLINICAL DATA:  NG tube placement EXAM: ABDOMEN - 1 VIEW COMPARISON:  01/02/2019 FINDINGS: Orogastric tube with the tip projecting over the stomach. There is no bowel dilatation to suggest obstruction. There is no evidence of pneumoperitoneum, portal venous gas or pneumatosis. There are no pathologic calcifications along the expected course of the ureters. There is bilateral interstitial thickening at the lung bases with bilateral small pleural effusions. The osseous structures are unremarkable. IMPRESSION: Orogastric tube with the tip projecting over the stomach. Electronically Signed   By: Kathreen Devoid   On: 01/10/2019 12:21    Assessment/Plan Active Problems:   Acute on chronic respiratory failure with hypoxia (HCC)   Chronic kidney disease, stage IV (severe) (HCC)   Acute lower GI bleeding   Bacterial lobar pneumonia   1. Acute on chronic respiratory failure  with hypoxia we will continue with wean on pressure support mode as already mentioned 8 hours completed we will continue to advance as tolerated. 2. Chronic kidney disease stage IV being followed by nephrology will continue with their recommendations. 3. Acute lower GI bleed no active bleeding is noted at this time we will continue to monitor 4. Bacterial pneumonia treated we will continue with supportive care.   I have personally seen and evaluated the patient, evaluated laboratory and imaging  results, formulated the assessment and plan and placed orders. The Patient requires high complexity decision making for assessment and support.  Case was discussed on Rounds with the Respiratory Therapy Staff  Allyne Gee, MD [facility] Pulmonary Critical Care Medicine Sleep Medicine

## 2019-01-11 DIAGNOSIS — K922 Gastrointestinal hemorrhage, unspecified: Secondary | ICD-10-CM | POA: Diagnosis not present

## 2019-01-11 DIAGNOSIS — J9621 Acute and chronic respiratory failure with hypoxia: Secondary | ICD-10-CM | POA: Diagnosis not present

## 2019-01-11 DIAGNOSIS — J159 Unspecified bacterial pneumonia: Secondary | ICD-10-CM | POA: Diagnosis not present

## 2019-01-11 DIAGNOSIS — N184 Chronic kidney disease, stage 4 (severe): Secondary | ICD-10-CM | POA: Diagnosis not present

## 2019-01-11 LAB — RENAL FUNCTION PANEL
Albumin: 1.8 g/dL — ABNORMAL LOW (ref 3.5–5.0)
Anion gap: 11 (ref 5–15)
BUN: 49 mg/dL — ABNORMAL HIGH (ref 8–23)
CO2: 24 mmol/L (ref 22–32)
Calcium: 8.4 mg/dL — ABNORMAL LOW (ref 8.9–10.3)
Chloride: 93 mmol/L — ABNORMAL LOW (ref 98–111)
Creatinine, Ser: 2.87 mg/dL — ABNORMAL HIGH (ref 0.44–1.00)
GFR calc Af Amer: 17 mL/min — ABNORMAL LOW (ref >60)
GFR calc non Af Amer: 15 mL/min — ABNORMAL LOW (ref >60)
Glucose, Bld: 117 mg/dL — ABNORMAL HIGH (ref 70–99)
Phosphorus: 1.1 mg/dL — ABNORMAL LOW (ref 2.5–4.6)
Potassium: 3.2 mmol/L — ABNORMAL LOW (ref 3.5–5.1)
Sodium: 128 mmol/L — ABNORMAL LOW (ref 135–145)

## 2019-01-11 LAB — CBC
HCT: 22.6 % — ABNORMAL LOW (ref 36.0–46.0)
Hemoglobin: 7 g/dL — ABNORMAL LOW (ref 12.0–15.0)
MCH: 28.2 pg (ref 26.0–34.0)
MCHC: 31 g/dL (ref 30.0–36.0)
MCV: 91.1 fL (ref 80.0–100.0)
Platelets: 170 10*3/uL (ref 150–400)
RBC: 2.48 MIL/uL — ABNORMAL LOW (ref 3.87–5.11)
RDW: 16.6 % — ABNORMAL HIGH (ref 11.5–15.5)
WBC: 8.2 10*3/uL (ref 4.0–10.5)
nRBC: 0.2 % (ref 0.0–0.2)

## 2019-01-11 LAB — OCCULT BLOOD X 1 CARD TO LAB, STOOL: Fecal Occult Bld: POSITIVE — AB

## 2019-01-11 LAB — PREPARE RBC (CROSSMATCH)

## 2019-01-11 LAB — SARS CORONAVIRUS 2 (TAT 6-24 HRS): SARS Coronavirus 2: NEGATIVE

## 2019-01-11 NOTE — Progress Notes (Signed)
[facility]  ROUNDING NOTE   Subjective:  Patient due for hemodialysis again today. She continues to be critically ill. Patient still on mechanical ventilation.    Objective:  Vital signs in last 24 hours:  Temperature 96 pulse 99 respiration 34 blood pressure 153/69   Physical Exam: General: Critically ill-appearing  Head: Endotracheal tube noted to be in place NG in place  Eyes: Anicteric  Neck: Supple, trachea midline  Lungs:  Scattered rhonchi rales, vent assisted  Heart: S1S2 no rubs  Abdomen:  Soft, mild distension  Extremities: 3+ peripheral edema.  Neurologic: Arousable but not following commands  Skin: Weeping skin from both upper extremities  Access: Left IJ temporary dialysis catheter    Basic Metabolic Panel: Recent Labs  Lab 01/05/19 0643 01/06/19 0617 01/08/19 0720 01/11/19 0718  NA 137 135 129* 128*  K 5.0 4.3 3.8 3.2*  CL 101 98 93* 93*  CO2 22 25 23 24   GLUCOSE 145* 145* 112* 117*  BUN 100* 101* 71* 49*  CREATININE 3.39* 3.57* 3.01* 2.87*  CALCIUM 9.1 9.8 8.9 8.4*  MG 1.9 2.0  --   --   PHOS 4.2 4.6 1.9* 1.1*    Liver Function Tests: Recent Labs  Lab 01/05/19 0643 01/06/19 0617 01/08/19 0720 01/11/19 0718  ALBUMIN 1.9* 2.1* 1.9* 1.8*   No results for input(s): LIPASE, AMYLASE in the last 168 hours. No results for input(s): AMMONIA in the last 168 hours.  CBC: Recent Labs  Lab 01/05/19 0643 01/06/19 0617 01/08/19 0720 01/11/19 0718  WBC 8.1 17.2* 9.2 8.2  HGB 8.7* 9.7* 8.1* 7.0*  HCT 27.7* 31.9* 26.0* 22.6*  MCV 92.6 93.0 91.9 91.1  PLT 159 276 185 170    Cardiac Enzymes: No results for input(s): CKTOTAL, CKMB, CKMBINDEX, TROPONINI in the last 168 hours.  BNP: Invalid input(s): POCBNP  CBG: No results for input(s): GLUCAP in the last 168 hours.  Microbiology: Results for orders placed or performed during the hospital encounter of 01/01/19  Culture, Urine     Status: Abnormal   Collection Time: 01/04/19  12:15 PM   Specimen: Urine, Random  Result Value Ref Range Status   Specimen Description URINE, RANDOM  Final   Special Requests   Final    NONE Performed at [facility], [address] [place], [place] [address]    Culture >=100,000 COLONIES/mL YEAST (A)  Final   Report Status 01/05/2019 FINAL  Final  C difficile quick scan w PCR reflex     Status: Abnormal   Collection Time: 01/04/19 12:15 PM   Specimen: STOOL  Result Value Ref Range Status   C Diff antigen POSITIVE (A) NEGATIVE Final   C Diff toxin NEGATIVE NEGATIVE Final   C Diff interpretation Results are indeterminate. See PCR results.  Final    Comment: Performed at [facility], [place] [address] [place], [place] [address]  C. Diff by PCR, Reflexed     Status: Abnormal   Collection Time: 01/04/19 12:15 PM  Result Value Ref Range Status   Toxigenic C. Difficile by PCR POSITIVE (A) NEGATIVE Final    Comment: Positive for toxigenic C. difficile with little to no toxin production. Only treat if clinical presentation suggests symptomatic illness. Performed at [facility], [place] [address], [place], [place] [address]   Culture, blood (routine x 2)     Status: None   Collection Time: 01/04/19 12:40 PM   Specimen: BLOOD  Result Value Ref Range  Status   Specimen Description BLOOD RIGHT ANTECUBITAL  Final   Special Requests   Final    BOTTLES DRAWN AEROBIC ONLY Blood Culture results may not be optimal due to an inadequate volume of blood received in culture bottles   Culture   Final    NO GROWTH 5 DAYS Performed at [facility], [place] [address] [place], [place] [address]    Report Status 01/09/2019 FINAL  Final  Culture, blood (routine x 2)     Status: None   Collection Time: 01/04/19 12:45 PM   Specimen: BLOOD RIGHT HAND  Result Value Ref Range Status   Specimen Description BLOOD RIGHT HAND  Final   Special Requests   Final    BOTTLES DRAWN AEROBIC ONLY Blood Culture adequate volume   Culture   Final     NO GROWTH 5 DAYS Performed at [facility], [place] [address], [place], [place] [address]    Report Status 01/09/2019 FINAL  Final  Culture, respiratory     Status: None   Collection Time: 01/04/19  2:27 PM   Specimen: Tracheal Aspirate  Result Value Ref Range Status   Specimen Description TRACHEAL ASPIRATE  Final   Special Requests NONE  Final   Gram Stain   Final    ABUNDANT WBC PRESENT, PREDOMINANTLY PMN RARE SQUAMOUS EPITHELIAL CELLS PRESENT NO ORGANISMS SEEN Performed at [facility], [place] [address] [place], [place] [address]    Culture FEW CITROBACTER FREUNDII  Final   Report Status 01/06/2019 FINAL  Final   Organism ID, Bacteria CITROBACTER FREUNDII  Final      Susceptibility   Citrobacter freundii - MIC*    CEFAZOLIN >=64 RESISTANT Resistant     CEFEPIME <=1 SENSITIVE Sensitive     CEFTAZIDIME >=64 RESISTANT Resistant     CEFTRIAXONE >=64 RESISTANT Resistant     CIPROFLOXACIN <=0.25 SENSITIVE Sensitive     GENTAMICIN <=1 SENSITIVE Sensitive     IMIPENEM <=0.25 SENSITIVE Sensitive     TRIMETH/SULFA <=20 SENSITIVE Sensitive     PIP/TAZO 64 INTERMEDIATE Intermediate     * FEW CITROBACTER FREUNDII    Coagulation Studies: No results for input(s): LABPROT, INR in the last 72 hours.  Urinalysis: No results for input(s): COLORURINE, LABSPEC, PHURINE, GLUCOSEU, HGBUR, BILIRUBINUR, KETONESUR, PROTEINUR, UROBILINOGEN, NITRITE, LEUKOCYTESUR in the last 72 hours.  Invalid input(s): APPERANCEUR    Imaging: Dg Abd 1 View  Result Date: 01/10/2019 CLINICAL DATA:  NG tube placement EXAM: ABDOMEN - 1 VIEW COMPARISON:  01/02/2019 FINDINGS: Orogastric tube with the tip projecting over the stomach. There is no bowel dilatation to suggest obstruction. There is no evidence of pneumoperitoneum, portal venous gas or pneumatosis. There are no pathologic calcifications along the expected course of the ureters. There is bilateral interstitial thickening at the lung bases with  bilateral small pleural effusions. The osseous structures are unremarkable. IMPRESSION: Orogastric tube with the tip projecting over the stomach. Electronically Signed   By: Kathreen Devoid   On: 01/10/2019 12:21     Medications:     lidocaine (PF)  Assessment/ Plan:  78 y.o. female with a PMHx of congestive heart failure, rheumatoid arthritis, chronic kidney disease stage IV, hyperlipidemia, fibromyalgia, giant cell arteritis, hypertension, thoracic ascending aortic aneurysm, duodenal ulcers, severe diverticulosis, coronary artery disease, hypothyroidism, who was admitted to [facility] on 01/01/2019 for ongoing treatment of acute respiratory failure, generalized debility, and severe renal dysfunction.   1.  Acute renal failure/chronic kidney disease stage IV.  Renal parameters have improved with dialysis treatment.  However she remains dialysis dependent at this time.  We plan for hemodialysis today.  2.  Acute respiratory failure.  Patient still requiring ventilator support.  Hopefully ultrafiltration will help to wean him from the ventilator.  3.  Anemia chronic kidney disease.  Hemoglobin is dropped to 7.0.  Consider PRBC transfusion but defer to primary team.    LOS: 0 Nastashia Gallo 9/21/20201:58 PM

## 2019-01-11 NOTE — Progress Notes (Signed)
Pulmonary [place]   PULMONARY CRITICAL CARE SERVICE  PROGRESS NOTE  Date of Service: 01/11/2019  Emanuelly Bleecker  [id number]  DOB: 1941-01-04   DOA: 01/01/2019  Referring Physician: Merton Border, MD  HPI: Jaynah Motzer is a 78 y.o. female seen for follow up of Acute on Chronic Respiratory Failure.  Patient is doing relatively well right now on pressure support has been on 30% FiO2 with a goal of 12 hours  Medications: Reviewed on Rounds  Physical Exam:  Vitals: Temperature 96.0 pulse 99 respiratory rate 28 blood pressure 153/69 saturations 97%  Ventilator Settings mode ventilation pressure support FiO2 30% pressure support 12/5  . General: Comfortable at this time . Eyes: Grossly normal lids, irises & conjunctiva . ENT: grossly tongue is normal . Neck: no obvious mass . Cardiovascular: S1 S2 normal no gallop . Respiratory: No rhonchi coarse breath sounds are noted . Abdomen: soft . Skin: no rash seen on limited exam . Musculoskeletal: not rigid . Psychiatric:unable to assess . Neurologic: no seizure no involuntary movements         Lab Data:   Basic Metabolic Panel: Recent Labs  Lab 01/05/19 0643 01/06/19 0617 01/08/19 0720 01/11/19 0718  NA 137 135 129* 128*  K 5.0 4.3 3.8 3.2*  CL 101 98 93* 93*  CO2 22 25 23 24   GLUCOSE 145* 145* 112* 117*  BUN 100* 101* 71* 49*  CREATININE 3.39* 3.57* 3.01* 2.87*  CALCIUM 9.1 9.8 8.9 8.4*  MG 1.9 2.0  --   --   PHOS 4.2 4.6 1.9* 1.1*    ABG: No results for input(s): PHART, PCO2ART, PO2ART, HCO3, O2SAT in the last 168 hours.  Liver Function Tests: Recent Labs  Lab 01/05/19 0643 01/06/19 0617 01/08/19 0720 01/11/19 0718  ALBUMIN 1.9* 2.1* 1.9* 1.8*   No results for input(s): LIPASE, AMYLASE in the last 168 hours. No results for input(s): AMMONIA in the last 168 hours.  CBC: Recent Labs  Lab 01/05/19 0643 01/06/19 0617 01/08/19 0720 01/11/19 0718  WBC 8.1  17.2* 9.2 8.2  HGB 8.7* 9.7* 8.1* 7.0*  HCT 27.7* 31.9* 26.0* 22.6*  MCV 92.6 93.0 91.9 91.1  PLT 159 276 185 170    Cardiac Enzymes: No results for input(s): CKTOTAL, CKMB, CKMBINDEX, TROPONINI in the last 168 hours.  BNP (last 3 results) No results for input(s): BNP in the last 8760 hours.  ProBNP (last 3 results) No results for input(s): PROBNP in the last 8760 hours.  Radiological Exams: Dg Abd 1 View  Result Date: 01/10/2019 CLINICAL DATA:  NG tube placement EXAM: ABDOMEN - 1 VIEW COMPARISON:  01/02/2019 FINDINGS: Orogastric tube with the tip projecting over the stomach. There is no bowel dilatation to suggest obstruction. There is no evidence of pneumoperitoneum, portal venous gas or pneumatosis. There are no pathologic calcifications along the expected course of the ureters. There is bilateral interstitial thickening at the lung bases with bilateral small pleural effusions. The osseous structures are unremarkable. IMPRESSION: Orogastric tube with the tip projecting over the stomach. Electronically Signed   By: Kathreen Devoid   On: 01/10/2019 12:21    Assessment/Plan Active Problems:   Acute on chronic respiratory failure with hypoxia (HCC)   Chronic kidney disease, stage IV (severe) (HCC)   Acute lower GI bleeding   Bacterial lobar pneumonia   1. Acute on chronic respiratory failure with hypoxia we will continue with the wean on pressure support the goal is 12 hours 2. Chronic  kidney disease stage IV followed by nephrology continue with supportive care. 3. Bacterial pneumonia following with infectious disease will monitor closely 4. Acute lower GI bleed no sign of active bleeding at this time.   I have personally seen and evaluated the patient, evaluated laboratory and imaging results, formulated the assessment and plan and placed orders. The Patient requires high complexity decision making for assessment and support.  Case was discussed on Rounds with the Respiratory  Therapy Staff  Allyne Gee, MD [facility] Pulmonary Critical Care Medicine Sleep Medicine

## 2019-01-12 ENCOUNTER — Encounter
Admission: RE | Disposition: A | Discharge status: Other Acute Inpatient Hospital | Payer: Self-pay | Source: Other Acute Inpatient Hospital | Attending: Internal Medicine

## 2019-01-12 ENCOUNTER — Encounter (HOSPITAL_COMMUNITY): Payer: Medicare Other | Admitting: Certified Registered"

## 2019-01-12 ENCOUNTER — Inpatient Hospital Stay (HOSPITAL_COMMUNITY): Admission: RE | Admit: 2019-01-12 | Payer: Medicare Other | Source: Home / Self Care | Admitting: Otolaryngology

## 2019-01-12 ENCOUNTER — Other Ambulatory Visit (HOSPITAL_COMMUNITY): Payer: Medicare Other

## 2019-01-12 ENCOUNTER — Encounter: Payer: Self-pay | Admitting: Certified Registered Nurse Anesthetist

## 2019-01-12 DIAGNOSIS — N184 Chronic kidney disease, stage 4 (severe): Secondary | ICD-10-CM | POA: Diagnosis not present

## 2019-01-12 DIAGNOSIS — J159 Unspecified bacterial pneumonia: Secondary | ICD-10-CM | POA: Diagnosis not present

## 2019-01-12 DIAGNOSIS — K922 Gastrointestinal hemorrhage, unspecified: Secondary | ICD-10-CM | POA: Diagnosis not present

## 2019-01-12 DIAGNOSIS — J9621 Acute and chronic respiratory failure with hypoxia: Secondary | ICD-10-CM | POA: Diagnosis not present

## 2019-01-12 HISTORY — PX: TRACHEOSTOMY TUBE PLACEMENT: SHX814

## 2019-01-12 LAB — CBC
HCT: 24.9 % — ABNORMAL LOW (ref 36.0–46.0)
Hemoglobin: 8.4 g/dL — ABNORMAL LOW (ref 12.0–15.0)
MCH: 29.5 pg (ref 26.0–34.0)
MCHC: 33.7 g/dL (ref 30.0–36.0)
MCV: 87.4 fL (ref 80.0–100.0)
Platelets: 159 10*3/uL (ref 150–400)
RBC: 2.85 MIL/uL — ABNORMAL LOW (ref 3.87–5.11)
RDW: 15.6 % — ABNORMAL HIGH (ref 11.5–15.5)
WBC: 8.3 10*3/uL (ref 4.0–10.5)
nRBC: 0 % (ref 0.0–0.2)

## 2019-01-12 LAB — POTASSIUM: Potassium: 3.1 mmol/L — ABNORMAL LOW (ref 3.5–5.1)

## 2019-01-12 SURGERY — CREATION, TRACHEOSTOMY
Anesthesia: General | Site: Neck

## 2019-01-12 MED ORDER — FENTANYL CITRATE (PF) 100 MCG/2ML IJ SOLN
INTRAMUSCULAR | Status: DC | PRN
  Administered 2019-01-12 (×2): 25 ug via INTRAVENOUS

## 2019-01-12 MED ORDER — ROCURONIUM BROMIDE 50 MG/5ML IV SOSY
PREFILLED_SYRINGE | INTRAVENOUS | Status: DC | PRN
  Administered 2019-01-12: 40 mg via INTRAVENOUS

## 2019-01-12 MED ORDER — SODIUM CHLORIDE 0.9 % IV SOLN
INTRAVENOUS | Status: DC | PRN
  Administered 2019-01-12: 11:00:00 via INTRAVENOUS

## 2019-01-12 MED ORDER — 0.9 % SODIUM CHLORIDE (POUR BTL) OPTIME
TOPICAL | Status: DC | PRN
  Administered 2019-01-12: 1000 mL

## 2019-01-12 MED ORDER — LIDOCAINE-EPINEPHRINE 1 %-1:100000 IJ SOLN
INTRAMUSCULAR | Status: DC | PRN
  Administered 2019-01-12: 3 mL

## 2019-01-12 MED ORDER — FENTANYL CITRATE (PF) 250 MCG/5ML IJ SOLN
INTRAMUSCULAR | Status: AC
  Filled 2019-01-12: qty 5

## 2019-01-12 SURGICAL SUPPLY — 37 items
BLADE SURG 15 STRL LF DISP TIS (BLADE) ×1 IMPLANT
BLADE SURG 15 STRL SS (BLADE) ×2
CLEANER TIP ELECTROSURG 2X2 (MISCELLANEOUS) ×3 IMPLANT
COVER SURGICAL LIGHT HANDLE (MISCELLANEOUS) ×3 IMPLANT
DRAPE HALF SHEET 40X57 (DRAPES) ×3 IMPLANT
DRESSING ALLEVYN 2X4 GNTL LT (GAUZE/BANDAGES/DRESSINGS) ×3 IMPLANT
ELECT COATED BLADE 2.86 ST (ELECTRODE) ×3 IMPLANT
ELECT REM PT RETURN 9FT ADLT (ELECTROSURGICAL) ×3
ELECTRODE REM PT RTRN 9FT ADLT (ELECTROSURGICAL) ×1 IMPLANT
GAUZE 4X4 16PLY RFD (DISPOSABLE) ×3 IMPLANT
GEL ULTRASOUND 20GR AQUASONIC (MISCELLANEOUS) ×3 IMPLANT
GLOVE SS BIOGEL STRL SZ 7.5 (GLOVE) ×2 IMPLANT
GLOVE SUPERSENSE BIOGEL SZ 7.5 (GLOVE) ×4
GOWN STRL REUS W/ TWL LRG LVL3 (GOWN DISPOSABLE) ×1 IMPLANT
GOWN STRL REUS W/ TWL XL LVL3 (GOWN DISPOSABLE) ×1 IMPLANT
GOWN STRL REUS W/TWL LRG LVL3 (GOWN DISPOSABLE) ×2
GOWN STRL REUS W/TWL XL LVL3 (GOWN DISPOSABLE) ×2
HOLDER TRACH TUBE VELCRO 19.5 (MISCELLANEOUS) ×3 IMPLANT
KIT BASIN OR (CUSTOM PROCEDURE TRAY) ×3 IMPLANT
KIT SUCTION CATH 14FR (SUCTIONS) ×3 IMPLANT
KIT TURNOVER KIT B (KITS) ×3 IMPLANT
NEEDLE HYPO 25GX1X1/2 BEV (NEEDLE) ×3 IMPLANT
NS IRRIG 1000ML POUR BTL (IV SOLUTION) ×3 IMPLANT
PACK EENT II TURBAN DRAPE (CUSTOM PROCEDURE TRAY) ×3 IMPLANT
PAD ARMBOARD 7.5X6 YLW CONV (MISCELLANEOUS) ×3 IMPLANT
PENCIL BUTTON HOLSTER BLD 10FT (ELECTRODE) ×3 IMPLANT
SPONGE DRAIN TRACH 4X4 STRL 2S (GAUZE/BANDAGES/DRESSINGS) ×3 IMPLANT
SPONGE INTESTINAL PEANUT (DISPOSABLE) ×3 IMPLANT
SUT SILK 2 0 SH CR/8 (SUTURE) ×3 IMPLANT
SUT SILK 3 0 TIES 10X30 (SUTURE) IMPLANT
SYR 5ML LUER SLIP (SYRINGE) ×3 IMPLANT
SYR CONTROL 10ML LL (SYRINGE) ×3 IMPLANT
TOWEL GREEN STERILE (TOWEL DISPOSABLE) ×3 IMPLANT
TOWEL GREEN STERILE FF (TOWEL DISPOSABLE) ×3 IMPLANT
TUBE CONNECTING 12'X1/4 (SUCTIONS) ×1
TUBE CONNECTING 12X1/4 (SUCTIONS) ×2 IMPLANT
TUBE TRACH SHILEY  6 DIST  CUF (TUBING) ×3 IMPLANT

## 2019-01-12 NOTE — Anesthesia Postprocedure Evaluation (Signed)
Anesthesia Post Note  Patient: Stephanie Stark  Procedure(s) Performed: TRACHEOSTOMY (N/A Neck)     Patient location during evaluation: SICU Anesthesia Type: General Level of consciousness: sedated Pain management: pain level controlled Vital Signs Assessment: post-procedure vital signs reviewed and stable Respiratory status: patient on ventilator - see flowsheet for VS Cardiovascular status: stable Anesthetic complications: no    Last Vitals: There were no vitals filed for this visit.  Last Pain: There were no vitals filed for this visit.               Nolon Nations

## 2019-01-12 NOTE — Transfer of Care (Signed)
Immediate Anesthesia Transfer of Care Note  Patient: Stephanie Stark  Procedure(s) Performed: TRACHEOSTOMY (N/A Neck)  Patient Location: Nursing Unit  Anesthesia Type:General  Level of Consciousness: Patient remains intubated per anesthesia plan  Airway & Oxygen Therapy: Patient placed on Ventilator (see vital sign flow sheet for setting)  Post-op Assessment: Report given to RN and Post -op Vital signs reviewed and stable  Post vital signs: Reviewed and stable  Last Vitals:  Vitals Value Taken Time  BP 109/65   Temp    Pulse 81   Resp 20   SpO2 100%     Last Pain: There were no vitals filed for this visit.       Complications: No apparent anesthesia complications

## 2019-01-12 NOTE — H&P (Signed)
PREOPERATIVE H&P  Chief Complaint: Acute on chronic respiratory failure  HPI: Stephanie Stark is a 78 y.o. female who presents for evaluation of acute respiratory failure for possible tracheostomy.  Patient with history of hypertension, congestive heart failure, coronary artery disease, rheumatoid arthritis, chronic kidney disease, giant cell arteritis.  Patient was admitted to the outside hospital several weeks ago because of upper and lower GI bleeds that required multiple transfusions.  The hospitalization was complicated by pneumonia requiring intubation.  Initial attempts at extubation outside hospital resulted and was back to her distress and reevaluation.  [facility] and on 01/01/2019 intubated on vent for prolonged care and weaning.  Patient is presently on dialysis for chronic kidney disease.  Attempts at weaning the patient off of the ventilator have been unsuccessful and tracheostomy was recommended.  Patient is taken to the operating room this time for tracheostomy.  Past Medical History:  Diagnosis Date  . Acute lower GI bleeding   . Acute on chronic respiratory failure with hypoxia ([place])   . Bacterial lobar pneumonia   . Chronic kidney disease, stage IV (severe) (HCC)    Past Surgical History:  Procedure Laterality Date  . IR FLUORO GUIDE CV LINE LEFT  01/04/2019  . IR US GUIDE VASC ACCESS LEFT  01/04/2019   Social History   Socioeconomic History  . Marital status: Divorced    Spouse name: Not on file  . Number of children: Not on file  . Years of education: Not on file  . Highest education level: Not on file  Occupational History  . Not on file  Social Needs  . Financial resource strain: Not on file  . Food insecurity    Worry: Not on file    Inability: Not on file  . Transportation needs    Medical: Not on file    Non-medical: Not on file  Tobacco Use  . Smoking status: Not on file  Substance and Sexual Activity   . Alcohol use: Not on file  . Drug use: Not on file  . Sexual activity: Not on file  Lifestyle  . Physical activity    Days per week: Not on file    Minutes per session: Not on file  . Stress: Not on file  Relationships  . Social Herbalist on phone: Not on file    Gets together: Not on file    Attends religious service: Not on file    Active member of club or organization: Not on file    Attends meetings of clubs or organizations: Not on file    Relationship status: Not on file  Other Topics Concern  . Not on file  Social History Narrative  . Not on file   History reviewed. No pertinent family history. Not on File Prior to Admission medications   Not on File     Positive ROS: Negative  All other systems have been reviewed and were otherwise negative with the exception of those mentioned in the HPI and as above.  Physical Exam: There were no vitals filed for this visit.  General: Patient is intubated and on ventilator and is poorly responsive Oral: Intubated Neck: No palpable adenopathy or thyroid nodules.  Trachea is palpably midline without masses. Cardiovascular: Regular rate and rhythm, no murmur.  Respiratory: Clear to auscultation   Assessment/Plan: respiratory failure Plan for Procedure(s): TRACHEOSTOMY   Melony Overly, MD 01/12/2019 10:11 AM

## 2019-01-12 NOTE — Progress Notes (Signed)
Pulmonary [place]   PULMONARY CRITICAL CARE SERVICE  PROGRESS NOTE  Date of Service: 01/12/2019  Stephanie Stark  [name]  DOB: Jun 08, 1940   DOA: 01/01/2019  Referring Physician: Merton Border, MD  HPI: Stephanie Stark is a 78 y.o. female seen for follow up of Acute on Chronic Respiratory Failure.  Patient currently is on pressure support mode with a goal of 20 hours patient is tolerating it fairly well.  Medications: Reviewed on Rounds  Physical Exam:  Vitals: Temperature 99.8 pulse 96 respiratory rate 23 blood pressure 115/84 saturations 98%  Ventilator Settings mode ventilation pressure support FiO2 35% pressure support 12 PEEP 5  . General: Comfortable at this time . Eyes: Grossly normal lids, irises & conjunctiva . ENT: grossly tongue is normal . Neck: no obvious mass . Cardiovascular: S1 S2 normal no gallop . Respiratory: No rhonchi coarse breath sounds are noted . Abdomen: soft . Skin: no rash seen on limited exam . Musculoskeletal: not rigid . Psychiatric:unable to assess . Neurologic: no seizure no involuntary movements         Lab Data:   Basic Metabolic Panel: Recent Labs  Lab 01/06/19 0617 01/08/19 0720 01/11/19 0718 01/12/19 0520  NA 135 129* 128*  --   K 4.3 3.8 3.2* 3.1*  CL 98 93* 93*  --   CO2 25 23 24   --   GLUCOSE 145* 112* 117*  --   BUN 101* 71* 49*  --   CREATININE 3.57* 3.01* 2.87*  --   CALCIUM 9.8 8.9 8.4*  --   MG 2.0  --   --   --   PHOS 4.6 1.9* 1.1*  --     ABG: No results for input(s): PHART, PCO2ART, PO2ART, HCO3, O2SAT in the last 168 hours.  Liver Function Tests: Recent Labs  Lab 01/06/19 0617 01/08/19 0720 01/11/19 0718  ALBUMIN 2.1* 1.9* 1.8*   No results for input(s): LIPASE, AMYLASE in the last 168 hours. No results for input(s): AMMONIA in the last 168 hours.  CBC: Recent Labs  Lab 01/06/19 0617 01/08/19 0720 01/11/19 0718 01/12/19 0520  WBC 17.2* 9.2 8.2  8.3  HGB 9.7* 8.1* 7.0* 8.4*  HCT 31.9* 26.0* 22.6* 24.9*  MCV 93.0 91.9 91.1 87.4  PLT 276 185 170 159    Cardiac Enzymes: No results for input(s): CKTOTAL, CKMB, CKMBINDEX, TROPONINI in the last 168 hours.  BNP (last 3 results) No results for input(s): BNP in the last 8760 hours.  ProBNP (last 3 results) No results for input(s): PROBNP in the last 8760 hours.  Radiological Exams: Dg Abd 1 View  Result Date: 01/10/2019 CLINICAL DATA:  NG tube placement EXAM: ABDOMEN - 1 VIEW COMPARISON:  01/02/2019 FINDINGS: Orogastric tube with the tip projecting over the stomach. There is no bowel dilatation to suggest obstruction. There is no evidence of pneumoperitoneum, portal venous gas or pneumatosis. There are no pathologic calcifications along the expected course of the ureters. There is bilateral interstitial thickening at the lung bases with bilateral small pleural effusions. The osseous structures are unremarkable. IMPRESSION: Orogastric tube with the tip projecting over the stomach. Electronically Signed   By: Kathreen Devoid   On: 01/10/2019 12:21    Assessment/Plan Active Problems:   Acute on chronic respiratory failure with hypoxia (HCC)   Chronic kidney disease, stage IV (severe) (HCC)   Acute lower GI bleeding   Bacterial lobar pneumonia   1. Acute on chronic respiratory failure with hypoxia we  will continue with pressure support mode as ordered continue secretion management pulmonary toilet 2. Chronic kidney disease stage IV patient being followed by nephrology for dialysis 3. Acute lower GI bleed no active bleeding 4. Lobar pneumonia treated we will continue with supportive care   I have personally seen and evaluated the patient, evaluated laboratory and imaging results, formulated the assessment and plan and placed orders. The Patient requires high complexity decision making for assessment and support.  Case was discussed on Rounds with the Respiratory Therapy Staff  Allyne Gee, MD [facility] Pulmonary Critical Care Medicine Sleep Medicine

## 2019-01-12 NOTE — Anesthesia Preprocedure Evaluation (Signed)
Anesthesia Evaluation  Patient identified by MRN, date of birth, ID band Patient awake    Reviewed: Allergy & Precautions, NPO status , Patient's Chart, lab work & pertinent test results  Airway Mallampati: II  TM Distance: >3 FB Neck ROM: Full    Dental  (+) Dental Advisory Given   Pulmonary pneumonia,    Pulmonary exam normal breath sounds clear to auscultation       Cardiovascular negative cardio ROS Normal cardiovascular exam Rhythm:Regular Rate:Normal     Neuro/Psych negative neurological ROS  negative psych ROS   GI/Hepatic negative GI ROS, Neg liver ROS,   Endo/Other  negative endocrine ROS  Renal/GU Renal disease     Musculoskeletal negative musculoskeletal ROS (+)   Abdominal   Peds  Hematology negative hematology ROS (+)   Anesthesia Other Findings   Reproductive/Obstetrics negative OB ROS                             Anesthesia Physical Anesthesia Plan  ASA: IV  Anesthesia Plan: General   Post-op Pain Management:    Induction: Inhalational  PONV Risk Score and Plan:   Airway Management Planned: Oral ETT  Additional Equipment: None  Intra-op Plan:   Post-operative Plan: Post-operative intubation/ventilation  Informed Consent: I have reviewed the patients History and Physical, chart, labs and discussed the procedure including the risks, benefits and alternatives for the proposed anesthesia with the patient or authorized representative who has indicated his/her understanding and acceptance.     Dental advisory given  Plan Discussed with: CRNA  Anesthesia Plan Comments:         Anesthesia Quick Evaluation

## 2019-01-12 NOTE — Interval H&P Note (Signed)
History and Physical Interval Note:  01/12/2019 10:18 AM  Bosie Clos  has presented today for surgery, with the diagnosis of respiratory failure.  The various methods of treatment have been discussed with the patient and family. After consideration of risks, benefits and other options for treatment, the patient has consented to  Procedure(s): TRACHEOSTOMY (N/A) as a surgical intervention.  The patient's history has been reviewed, patient examined, no change in status, stable for surgery.  I have reviewed the patient's chart and labs.  Questions were answered to the patient's satisfaction.     Melony Overly

## 2019-01-12 NOTE — Brief Op Note (Signed)
01/12/2019  11:48 AM  PATIENT:  Stephanie Stark  78 y.o. female  PRE-OPERATIVE DIAGNOSIS:  respiratory failure  POST-OPERATIVE DIAGNOSIS:  respiratory failure  PROCEDURE:  Procedure(s): TRACHEOSTOMY (N/A)  # 6 Shiley  SURGEON:  Surgeon(s) and Role:    * Lucia Gaskins, Leonides Sake, MD - Primary  PHYSICIAN ASSISTANT:   ASSISTANTS: none   ANESTHESIA:   general  EBL:  minimal   BLOOD ADMINISTERED:none  DRAINS: none   LOCAL MEDICATIONS USED:  XYLOCAINE with EPI 3 cc  SPECIMEN:  No Specimen  DISPOSITION OF SPECIMEN:  N/A  COUNTS:  YES  TOURNIQUET:  * No tourniquets in log *  DICTATION: .Other Dictation: Dictation Number [id number]  PLAN OF CARE: Discharge to home after PACU  PATIENT DISPOSITION:  PACU - hemodynamically stable.   Delay start of Pharmacological VTE agent (>24hrs) due to surgical blood loss or risk of bleeding: yes

## 2019-01-13 ENCOUNTER — Encounter (HOSPITAL_COMMUNITY): Payer: Self-pay | Admitting: Otolaryngology

## 2019-01-13 DIAGNOSIS — N184 Chronic kidney disease, stage 4 (severe): Secondary | ICD-10-CM | POA: Diagnosis not present

## 2019-01-13 DIAGNOSIS — K922 Gastrointestinal hemorrhage, unspecified: Secondary | ICD-10-CM | POA: Diagnosis not present

## 2019-01-13 DIAGNOSIS — J9621 Acute and chronic respiratory failure with hypoxia: Secondary | ICD-10-CM | POA: Diagnosis not present

## 2019-01-13 DIAGNOSIS — J159 Unspecified bacterial pneumonia: Secondary | ICD-10-CM | POA: Diagnosis not present

## 2019-01-13 LAB — CBC
HCT: 27.1 % — ABNORMAL LOW (ref 36.0–46.0)
Hemoglobin: 8.7 g/dL — ABNORMAL LOW (ref 12.0–15.0)
MCH: 28.7 pg (ref 26.0–34.0)
MCHC: 32.1 g/dL (ref 30.0–36.0)
MCV: 89.4 fL (ref 80.0–100.0)
Platelets: 161 10*3/uL (ref 150–400)
RBC: 3.03 MIL/uL — ABNORMAL LOW (ref 3.87–5.11)
RDW: 15.9 % — ABNORMAL HIGH (ref 11.5–15.5)
WBC: 8.8 10*3/uL (ref 4.0–10.5)
nRBC: 0 % (ref 0.0–0.2)

## 2019-01-13 LAB — RENAL FUNCTION PANEL
Albumin: 1.9 g/dL — ABNORMAL LOW (ref 3.5–5.0)
Anion gap: 8 (ref 5–15)
BUN: 32 mg/dL — ABNORMAL HIGH (ref 8–23)
CO2: 26 mmol/L (ref 22–32)
Calcium: 8.3 mg/dL — ABNORMAL LOW (ref 8.9–10.3)
Chloride: 97 mmol/L — ABNORMAL LOW (ref 98–111)
Creatinine, Ser: 2.42 mg/dL — ABNORMAL HIGH (ref 0.44–1.00)
GFR calc Af Amer: 21 mL/min — ABNORMAL LOW (ref >60)
GFR calc non Af Amer: 18 mL/min — ABNORMAL LOW (ref >60)
Glucose, Bld: 109 mg/dL — ABNORMAL HIGH (ref 70–99)
Phosphorus: 1.3 mg/dL — ABNORMAL LOW (ref 2.5–4.6)
Potassium: 4 mmol/L (ref 3.5–5.1)
Sodium: 131 mmol/L — ABNORMAL LOW (ref 135–145)

## 2019-01-13 NOTE — Op Note (Signed)
NAMEROANNA, SCHAMBACH MEDICAL RECORD [id number] ACCOUNT [id number] DATE OF BIRTH:05/02/1940 FACILITY: [facility] LOCATION: [facility] PHYSICIAN:Avana Kreiser Lincoln Maxin, MD  OPERATIVE REPORT  DATE OF PROCEDURE:  01/12/2019  PREOPERATIVE DIAGNOSIS:  Acute on chronic respiratory failure.  POSTOPERATIVE DIAGNOSIS:  Acute on chronic respiratory failure.  OPERATION PERFORMED:  Tracheostomy with a #6 Shiley tracheotomy tube.  SURGEON:  Melony Overly, MD  ANESTHESIA:  General endotracheal.  ESTIMATED BLOOD LOSS:  Less than 10 mL.  COMPLICATIONS:  None.  BRIEF CLINICAL NOTE:  The patient is a 78 year old female who was admitted to an outside hospital with upper and lower GI bleed requiring several units of transfusion.  Hospitalization was complicated by pneumonia requiring intubation and vent support.   Attempts to extubate her at the outside hospital were unsuccessful.  The patient had to be reintubated.  The patient was subsequently transferred to [facility] on 01/01/2019 intubated on the vent.  She also has history of chronic kidney  disease, is on dialysis.  Attempts at weaning her have been unsuccessful, and a tracheotomy was recommended.  The patient was taken to the operating room at this time for a tracheostomy.  DESCRIPTION OF PROCEDURE:  The patient was brought straight down from [facility] to the operating room.  Neck was extended with a roll underneath the shoulders.  The area of the tracheotomy was marked out and prepped with Betadine solution  and draped out in sterile towels.  The area was then injected with 3 mL of Xylocaine with epinephrine for hemostasis and local anesthetic.  A vertical incision was made just below the cricoid cartilage.  Dissection was carried down through the  subcutaneous tissue with cautery.  Strap muscles were divided in the midline and retracted laterally.  There was minimal bleeding, although she had 1 small bleeder at the  superior aspect of the incision in the region of the cricoid cartilage that was  cauterized.  After exposing the cricoid cartilage and the first 3 tracheal rings, a small portion of the thyroid isthmus had to be divided superiorly so as to expose the second and third tracheal rings.  We elected to perform a horizontal tracheotomy  tube between the first and second tracheal rings.  Endotracheal tube was removed, and a #6 Shiley tracheostomy tube was inserted without difficulty.  The patient was ventilated well, and this was secured to the neck with 2-0 silk sutures x4 and Velcro  trach collar around the neck.  At the time of removal of the endotracheal tube, the patient also had an oral gastric tube that was removed.  Attempts at inserting the nasogastric tube were unsuccessful as it kept hanging up around the region of the upper  esophageal sphincter on direct visualization using the laryngoscope.  The nasogastric tube passed behind the arytenoids, but again hung up in the region of the cartilage off upper esophageal sphincter.  There was likewise some food debris within this  region, and there was question whether she could have had a new diverticulum in this area making passing the nasogastric tube very difficult.  It was elected not to pass the nasogastric tube, and the patient was transferred back to [facility] with  a tracheotomy and no oral gastric or nasogastric tube in place.  [facility] will be notified of difficulty passing the NG tube.  LN/NUANCE  D:01/12/2019 T:01/12/2019 JOB:[id number]

## 2019-01-13 NOTE — Progress Notes (Signed)
Pulmonary Critical Care Medicine [place]   PULMONARY CRITICAL CARE SERVICE  PROGRESS NOTE  Date of Service: 01/13/2019  Stephanie Stark  [name]  DOB: [birth year]   DOA: 01/01/2019  Referring Physician: Merton Border, MD  HPI: Stephanie Stark is a 78 y.o. female seen for follow up of Acute on Chronic Respiratory Failure.  Patient currently is on pressure support mode had a tracheostomy done overnight and is actually doing well status post tracheostomy  Medications: Reviewed on Rounds  Physical Exam:  Vitals: Temperature 97.7 pulse 94 respiratory 18 blood pressure 128/54  Ventilator Settings mode ventilation pressure support FiO2 28% pressure 12 PEEP 5 tidal line 375  . General: Comfortable at this time . Eyes: Grossly normal lids, irises & conjunctiva . ENT: grossly tongue is normal . Neck: no obvious mass . Cardiovascular: S1 S2 normal no gallop . Respiratory: No rhonchi rales are noted at this time . Abdomen: soft . Skin: no rash seen on limited exam . Musculoskeletal: not rigid . Psychiatric:unable to assess . Neurologic: no seizure no involuntary movements         Lab Data:   Basic Metabolic Panel: Recent Labs  Lab 01/08/19 0720 01/11/19 0718 01/12/19 0520 01/13/19 0544  NA 129* 128*  --  131*  K 3.8 3.2* 3.1* 4.0  CL 93* 93*  --  97*  CO2 23 24  --  26  GLUCOSE 112* 117*  --  109*  BUN 71* 49*  --  32*  CREATININE 3.01* 2.87*  --  2.42*  CALCIUM 8.9 8.4*  --  8.3*  PHOS 1.9* 1.1*  --  1.3*    ABG: No results for input(s): PHART, PCO2ART, PO2ART, HCO3, O2SAT in the last 168 hours.  Liver Function Tests: Recent Labs  Lab 01/08/19 0720 01/11/19 0718 01/13/19 0544  ALBUMIN 1.9* 1.8* 1.9*   No results for input(s): LIPASE, AMYLASE in the last 168 hours. No results for input(s): AMMONIA in the last 168 hours.  CBC: Recent Labs  Lab 01/08/19 0720 01/11/19 0718 01/12/19 0520 01/13/19 0544  WBC 9.2 8.2 8.3 8.8  HGB 8.1*  7.0* 8.4* 8.7*  HCT 26.0* 22.6* 24.9* 27.1*  MCV 91.9 91.1 87.4 89.4  PLT 185 170 159 161    Cardiac Enzymes: No results for input(s): CKTOTAL, CKMB, CKMBINDEX, TROPONINI in the last 168 hours.  BNP (last 3 results) No results for input(s): BNP in the last 8760 hours.  ProBNP (last 3 results) No results for input(s): PROBNP in the last 8760 hours.  Radiological Exams: Dg Abd Portable 1v  Result Date: 01/12/2019 CLINICAL DATA:  NG tube placement Hx acute lower GI bleeding, chronic kidney disease EXAM: PORTABLE ABDOMEN - 1 VIEW COMPARISON:  01/10/2019 FINDINGS: Weighted-tip feeding tube extends to the region of the gastric antrum. Cholecystectomy clips. Endoscopic clip in the right mid abdomen. Normal bowel gas pattern. Orthopedic hardware in the right femur partially visualized. IMPRESSION: 1. Feeding tube placement to the gastric antrum. 2. Normal bowel gas pattern. Electronically Signed   By: Lucrezia Europe M.D.   On: 01/12/2019 17:01    Assessment/Plan Active Problems:   Acute on chronic respiratory failure with hypoxia (HCC)   Chronic kidney disease, stage IV (severe) (HCC)   Acute lower GI bleeding   Bacterial lobar pneumonia   1. Acute on chronic respiratory failure hypoxia patient currently is on pressure support mode 12/5 we will continue to wean as tolerated.  Patient does have a fresh trach set of coarse wheeze  takes the standard precautions 2. Chronic kidney disease stage IV we will continue with supportive care follow labs patient is being seen by nephrology 3. Acute lower GI bleed no sign of bleeding right now. 4. Bacterial lobar pneumonia we will continue with supportive care at this time.   I have personally seen and evaluated the patient, evaluated laboratory and imaging results, formulated the assessment and plan and placed orders. The Patient requires high complexity decision making for assessment and support.  Case was discussed on Rounds with the Respiratory  Therapy Staff  Allyne Gee, MD [facility] Pulmonary Critical Care Medicine Sleep Medicine

## 2019-01-14 DIAGNOSIS — N184 Chronic kidney disease, stage 4 (severe): Secondary | ICD-10-CM | POA: Diagnosis not present

## 2019-01-14 DIAGNOSIS — J159 Unspecified bacterial pneumonia: Secondary | ICD-10-CM | POA: Diagnosis not present

## 2019-01-14 DIAGNOSIS — K922 Gastrointestinal hemorrhage, unspecified: Secondary | ICD-10-CM | POA: Diagnosis not present

## 2019-01-14 DIAGNOSIS — J9621 Acute and chronic respiratory failure with hypoxia: Secondary | ICD-10-CM | POA: Diagnosis not present

## 2019-01-14 NOTE — Progress Notes (Signed)
[facility]  ROUNDING NOTE   Subjective:  Patient remains critically ill. Still on the ventilator. Completed dialysis yesterday. Renal parameters significantly improved.   Objective:  Vital signs in last 24 hours:  Temperature 98.9 pulse 88 respirations 20 blood pressure 135/56   Physical Exam: General: Critically ill-appearing  Head: Endotracheal tube noted to be in place NG in place  Eyes: Anicteric  Neck: Supple, trachea midline  Lungs:  Scattered rhonchi rales, vent assisted  Heart: S1S2 no rubs  Abdomen:  Soft, mild distension  Extremities: 2+ peripheral edema.  Neurologic: Arousable but not following commands  Skin: Weeping skin from both upper extremities  Access: Left IJ temporary dialysis catheter    Basic Metabolic Panel: Recent Labs  Lab 01/08/19 0720 01/11/19 0718 01/12/19 0520 01/13/19 0544  NA 129* 128*  --  131*  K 3.8 3.2* 3.1* 4.0  CL 93* 93*  --  97*  CO2 23 24  --  26  GLUCOSE 112* 117*  --  109*  BUN 71* 49*  --  32*  CREATININE 3.01* 2.87*  --  2.42*  CALCIUM 8.9 8.4*  --  8.3*  PHOS 1.9* 1.1*  --  1.3*    Liver Function Tests: Recent Labs  Lab 01/08/19 0720 01/11/19 0718 01/13/19 0544  ALBUMIN 1.9* 1.8* 1.9*   No results for input(s): LIPASE, AMYLASE in the last 168 hours. No results for input(s): AMMONIA in the last 168 hours.  CBC: Recent Labs  Lab 01/08/19 0720 01/11/19 0718 01/12/19 0520 01/13/19 0544  WBC 9.2 8.2 8.3 8.8  HGB 8.1* 7.0* 8.4* 8.7*  HCT 26.0* 22.6* 24.9* 27.1*  MCV 91.9 91.1 87.4 89.4  PLT 185 170 159 161    Cardiac Enzymes: No results for input(s): CKTOTAL, CKMB, CKMBINDEX, TROPONINI in the last 168 hours.  BNP: Invalid input(s): POCBNP  CBG: No results for input(s): GLUCAP in the last 168 hours.  Microbiology: Results for orders placed or performed during the hospital encounter of 01/12/19  SARS CORONAVIRUS 2 (TAT 6-24 HRS) Nasopharyngeal Nasopharyngeal Swab     Status: None   Collection Time: 01/11/19  2:45 PM   Specimen: Nasopharyngeal Swab  Result Value Ref Range Status   SARS Coronavirus 2 NEGATIVE NEGATIVE Final    Comment: (NOTE) SARS-CoV-2 target nucleic acids are NOT DETECTED. The SARS-CoV-2 RNA is generally detectable in upper and lower respiratory specimens during the acute phase of infection. Negative results do not preclude SARS-CoV-2 infection, do not rule out co-infections with other pathogens, and should not be used as the sole basis for treatment or other patient management decisions. Negative results must be combined with clinical observations, patient history, and epidemiological information. The expected result is Negative. Fact Sheet for Patients: SugarRoll.be Fact Sheet for Healthcare Providers: https://www.woods-mathews.com/ This test is not yet approved or cleared by the [place] FDA and  has been authorized for detection and/or diagnosis of SARS-CoV-2 by FDA under an Emergency Use Authorization (EUA). This EUA will remain  in effect (meaning this test can be used) for the duration of the COVID-19 declaration under Section 56 4(b)(1) of the Act, 21 U.S.C. section 360bbb-3(b)(1), unless the authorization is terminated or revoked sooner. Performed at [facility], [place] [address] [place], [place] [address]     Coagulation Studies: No results for input(s): LABPROT, INR in the last 72 hours.  Urinalysis: No results for input(s): COLORURINE, LABSPEC, PHURINE, GLUCOSEU, HGBUR, BILIRUBINUR, KETONESUR, PROTEINUR, UROBILINOGEN, NITRITE, LEUKOCYTESUR in the last 72 hours.  Invalid input(s): APPERANCEUR    Imaging: Dg Abd Portable 1v  Result Date: 01/12/2019 CLINICAL DATA:  NG tube placement Hx acute lower GI bleeding, chronic kidney disease EXAM: PORTABLE ABDOMEN - 1 VIEW COMPARISON:  01/10/2019 FINDINGS: Weighted-tip feeding tube extends to the region of the gastric antrum.  Cholecystectomy clips. Endoscopic clip in the right mid abdomen. Normal bowel gas pattern. Orthopedic hardware in the right femur partially visualized. IMPRESSION: 1. Feeding tube placement to the gastric antrum. 2. Normal bowel gas pattern. Electronically Signed   By: Lucrezia Europe M.D.   On: 01/12/2019 17:01     Medications:     lidocaine (PF)  Assessment/ Plan:  78 y.o. female with a PMHx of congestive heart failure, rheumatoid arthritis, chronic kidney disease stage IV, hyperlipidemia, fibromyalgia, giant cell arteritis, hypertension, thoracic ascending aortic aneurysm, duodenal ulcers, severe diverticulosis, coronary artery disease, hypothyroidism, who was admitted to [facility] on 01/01/2019 for ongoing treatment of acute respiratory failure, generalized debility, and severe renal dysfunction.   1.  Acute renal failure/chronic kidney disease stage IV.  Renal parameters have improved significantly with dialysis.  BUN down to 32 with a creatinine of 2.4.  We plan to perform dialysis again tomorrow.  Continue ultrafiltration.  2.  Acute respiratory failure.  Patient still requiring ventilatory support.  Continue ultrafiltration to aid with weaning from the ventilator.  3.  Anemia chronic kidney disease.  Hemoglobin up to 8.7 posttransfusion.  Continue to monitor CBC.    LOS: 0 Jowel Waltner 9/24/20209:18 AM

## 2019-01-14 NOTE — Progress Notes (Signed)
Pulmonary [place]   PULMONARY CRITICAL CARE SERVICE  PROGRESS NOTE  Date of Service: 01/14/2019  Stephanie Stark  [id number]  DOB: 04/07/1941   DOA: 01/01/2019  Referring Physician: Merton Border, MD  HPI: Stephanie Stark is a 78 y.o. female seen for follow up of Acute on Chronic Respiratory Failure.  Patient is comfortable right now without distress is on pressure support mode on 28% FiO2  Medications: Reviewed on Rounds  Physical Exam:  Vitals: Temperature 98.9 pulse 88 respiratory rate 20 blood pressure 135/56 saturations are 100%  Ventilator Settings mode ventilation pressure support FiO2 28% pressure support 12 PEEP 5  . General: Comfortable at this time . Eyes: Grossly normal lids, irises & conjunctiva . ENT: grossly tongue is normal . Neck: no obvious mass . Cardiovascular: S1 S2 normal no gallop . Respiratory: No rhonchi no rales are noted at this time . Abdomen: soft . Skin: no rash seen on limited exam . Musculoskeletal: not rigid . Psychiatric:unable to assess . Neurologic: no seizure no involuntary movements         Lab Data:   Basic Metabolic Panel: Recent Labs  Lab 01/08/19 0720 01/11/19 0718 01/12/19 0520 01/13/19 0544  NA 129* 128*  --  131*  K 3.8 3.2* 3.1* 4.0  CL 93* 93*  --  97*  CO2 23 24  --  26  GLUCOSE 112* 117*  --  109*  BUN 71* 49*  --  32*  CREATININE 3.01* 2.87*  --  2.42*  CALCIUM 8.9 8.4*  --  8.3*  PHOS 1.9* 1.1*  --  1.3*    ABG: No results for input(s): PHART, PCO2ART, PO2ART, HCO3, O2SAT in the last 168 hours.  Liver Function Tests: Recent Labs  Lab 01/08/19 0720 01/11/19 0718 01/13/19 0544  ALBUMIN 1.9* 1.8* 1.9*   No results for input(s): LIPASE, AMYLASE in the last 168 hours. No results for input(s): AMMONIA in the last 168 hours.  CBC: Recent Labs  Lab 01/08/19 0720 01/11/19 0718 01/12/19 0520 01/13/19 0544  WBC 9.2 8.2 8.3 8.8  HGB 8.1* 7.0* 8.4* 8.7*   HCT 26.0* 22.6* 24.9* 27.1*  MCV 91.9 91.1 87.4 89.4  PLT 185 170 159 161    Cardiac Enzymes: No results for input(s): CKTOTAL, CKMB, CKMBINDEX, TROPONINI in the last 168 hours.  BNP (last 3 results) No results for input(s): BNP in the last 8760 hours.  ProBNP (last 3 results) No results for input(s): PROBNP in the last 8760 hours.  Radiological Exams: Dg Abd Portable 1v  Result Date: 01/12/2019 CLINICAL DATA:  NG tube placement Hx acute lower GI bleeding, chronic kidney disease EXAM: PORTABLE ABDOMEN - 1 VIEW COMPARISON:  01/10/2019 FINDINGS: Weighted-tip feeding tube extends to the region of the gastric antrum. Cholecystectomy clips. Endoscopic clip in the right mid abdomen. Normal bowel gas pattern. Orthopedic hardware in the right femur partially visualized. IMPRESSION: 1. Feeding tube placement to the gastric antrum. 2. Normal bowel gas pattern. Electronically Signed   By: Lucrezia Europe M.D.   On: 01/12/2019 17:01    Assessment/Plan Active Problems:   Acute on chronic respiratory failure with hypoxia (HCC)   Chronic kidney disease, stage IV (severe) (HCC)   Acute lower GI bleeding   Bacterial lobar pneumonia   1. Acute on chronic respiratory failure with hypoxia patient is doing a wean on pressure support mode with a goal of 16 hours. 2. Chronic kidney disease stage IV we will continue with supportive care  3. Acute lower GI bleed patient is at baseline we will continue with present management 4. Bacterial lobar pneumonia treated we will continue with supportive care   I have personally seen and evaluated the patient, evaluated laboratory and imaging results, formulated the assessment and plan and placed orders. The Patient requires high complexity decision making for assessment and support.  Case was discussed on Rounds with the Respiratory Therapy Staff  Allyne Gee, MD [facility] Pulmonary Critical Care Medicine Sleep Medicine

## 2019-01-15 ENCOUNTER — Other Ambulatory Visit (HOSPITAL_COMMUNITY): Payer: Medicare Other

## 2019-01-15 DIAGNOSIS — N184 Chronic kidney disease, stage 4 (severe): Secondary | ICD-10-CM | POA: Diagnosis not present

## 2019-01-15 DIAGNOSIS — J159 Unspecified bacterial pneumonia: Secondary | ICD-10-CM | POA: Diagnosis not present

## 2019-01-15 DIAGNOSIS — J9621 Acute and chronic respiratory failure with hypoxia: Secondary | ICD-10-CM | POA: Diagnosis not present

## 2019-01-15 DIAGNOSIS — K922 Gastrointestinal hemorrhage, unspecified: Secondary | ICD-10-CM | POA: Diagnosis not present

## 2019-01-15 LAB — BPAM RBC
Blood Product Expiration Date: 202010152359
Blood Product Expiration Date: 202010182359
ISSUE DATE / TIME: 202009211708
Unit Type and Rh: 6200
Unit Type and Rh: 6200

## 2019-01-15 LAB — RENAL FUNCTION PANEL
Albumin: 2 g/dL — ABNORMAL LOW (ref 3.5–5.0)
Anion gap: 10 (ref 5–15)
BUN: 32 mg/dL — ABNORMAL HIGH (ref 8–23)
CO2: 26 mmol/L (ref 22–32)
Calcium: 8.9 mg/dL (ref 8.9–10.3)
Chloride: 97 mmol/L — ABNORMAL LOW (ref 98–111)
Creatinine, Ser: 2.06 mg/dL — ABNORMAL HIGH (ref 0.44–1.00)
GFR calc Af Amer: 26 mL/min — ABNORMAL LOW (ref >60)
GFR calc non Af Amer: 22 mL/min — ABNORMAL LOW (ref >60)
Glucose, Bld: 138 mg/dL — ABNORMAL HIGH (ref 70–99)
Phosphorus: 1.5 mg/dL — ABNORMAL LOW (ref 2.5–4.6)
Potassium: 3.6 mmol/L (ref 3.5–5.1)
Sodium: 133 mmol/L — ABNORMAL LOW (ref 135–145)

## 2019-01-15 LAB — CBC
HCT: 26 % — ABNORMAL LOW (ref 36.0–46.0)
Hemoglobin: 8.4 g/dL — ABNORMAL LOW (ref 12.0–15.0)
MCH: 28.9 pg (ref 26.0–34.0)
MCHC: 32.3 g/dL (ref 30.0–36.0)
MCV: 89.3 fL (ref 80.0–100.0)
Platelets: 134 10*3/uL — ABNORMAL LOW (ref 150–400)
RBC: 2.91 MIL/uL — ABNORMAL LOW (ref 3.87–5.11)
RDW: 15.9 % — ABNORMAL HIGH (ref 11.5–15.5)
WBC: 9.6 10*3/uL (ref 4.0–10.5)
nRBC: 0 % (ref 0.0–0.2)

## 2019-01-15 LAB — TYPE AND SCREEN
ABO/RH(D): A POS
Antibody Screen: POSITIVE
Donor AG Type: NEGATIVE
Donor AG Type: NEGATIVE
PT AG Type: NEGATIVE
Unit division: 0
Unit division: 0

## 2019-01-15 NOTE — Progress Notes (Signed)
[facility]  ROUNDING NOTE   Subjective:  Patient remains critically ill. Urine output was only 200 cc over the preceding 24 hours. We plan to continue dialysis treatments for the moment.   Objective:  Vital signs in last 24 hours:  Temperature 97.7 pulse 102 respirations 30 blood pressure 112/63   Physical Exam: General: Critically ill-appearing  Head: Endotracheal tube noted to be in place NG in place  Eyes: Anicteric  Neck: Supple, trachea midline  Lungs:  Scattered rhonchi rales, vent assisted  Heart: S1S2 no rubs  Abdomen:  Soft, mild distension  Extremities: 2+ peripheral edema.  Neurologic: Arousable, not following commands  Skin: Weeping skin from both upper extremities  Access: Left IJ temporary dialysis catheter    Basic Metabolic Panel: Recent Labs  Lab 01/11/19 0718 01/12/19 0520 01/13/19 0544 01/15/19 0537  NA 128*  --  131* 133*  K 3.2* 3.1* 4.0 3.6  CL 93*  --  97* 97*  CO2 24  --  26 26  GLUCOSE 117*  --  109* 138*  BUN 49*  --  32* 32*  CREATININE 2.87*  --  2.42* 2.06*  CALCIUM 8.4*  --  8.3* 8.9  PHOS 1.1*  --  1.3* 1.5*    Liver Function Tests: Recent Labs  Lab 01/11/19 0718 01/13/19 0544 01/15/19 0537  ALBUMIN 1.8* 1.9* 2.0*   No results for input(s): LIPASE, AMYLASE in the last 168 hours. No results for input(s): AMMONIA in the last 168 hours.  CBC: Recent Labs  Lab 01/11/19 0718 01/12/19 0520 01/13/19 0544 01/15/19 0537  WBC 8.2 8.3 8.8 9.6  HGB 7.0* 8.4* 8.7* 8.4*  HCT 22.6* 24.9* 27.1* 26.0*  MCV 91.1 87.4 89.4 89.3  PLT 170 159 161 134*    Cardiac Enzymes: No results for input(s): CKTOTAL, CKMB, CKMBINDEX, TROPONINI in the last 168 hours.  BNP: Invalid input(s): POCBNP  CBG: No results for input(s): GLUCAP in the last 168 hours.  Microbiology: Results for orders placed or performed during the hospital encounter of 01/12/19  SARS CORONAVIRUS 2 (TAT 6-24 HRS) Nasopharyngeal Nasopharyngeal Swab      Status: None   Collection Time: 01/11/19  2:45 PM   Specimen: Nasopharyngeal Swab  Result Value Ref Range Status   SARS Coronavirus 2 NEGATIVE NEGATIVE Final    Comment: (NOTE) SARS-CoV-2 target nucleic acids are NOT DETECTED. The SARS-CoV-2 RNA is generally detectable in upper and lower respiratory specimens during the acute phase of infection. Negative results do not preclude SARS-CoV-2 infection, do not rule out co-infections with other pathogens, and should not be used as the sole basis for treatment or other patient management decisions. Negative results must be combined with clinical observations, patient history, and epidemiological information. The expected result is Negative. Fact Sheet for Patients: SugarRoll.be Fact Sheet for Healthcare Providers: https://www.woods-mathews.com/ This test is not yet approved or cleared by the [place] FDA and  has been authorized for detection and/or diagnosis of SARS-CoV-2 by FDA under an Emergency Use Authorization (EUA). This EUA will remain  in effect (meaning this test can be used) for the duration of the COVID-19 declaration under Section 56 4(b)(1) of the Act, 21 U.S.C. section 360bbb-3(b)(1), unless the authorization is terminated or revoked sooner. Performed at [facility], [place] [address], [place], [place] [address]     Coagulation Studies: No results for input(s): LABPROT, INR in the last 72 hours.  Urinalysis: No results for input(s): COLORURINE, LABSPEC, [place], [place], [place], [place], [place], [place],  UROBILINOGEN, NITRITE, LEUKOCYTESUR in the last 72 hours.  Invalid input(s): APPERANCEUR    Imaging: No results found.   Medications:     lidocaine (PF)  Assessment/ Plan:  78 y.o. female with a PMHx of congestive heart failure, rheumatoid arthritis, chronic kidney disease stage IV, hyperlipidemia, fibromyalgia, giant cell arteritis, hypertension,  thoracic ascending aortic aneurysm, duodenal ulcers, severe diverticulosis, coronary artery disease, hypothyroidism, who was admitted to [facility] on 01/01/2019 for ongoing treatment of acute respiratory failure, generalized debility, and severe renal dysfunction.   1.  Acute renal failure/chronic kidney disease stage IV.  Renal parameters have improved with dialysis as before.  Predialysis labs show a creatinine of 2.06.  However urine output still low at only 200 cc over the preceding 24 hours.  Continue dialysis on MWF schedule for now..  2.  Acute respiratory failure.  Patient remains on the ventilator.  Continue current ventilatory support.  3.  Anemia chronic kidney disease.  Hemoglobin 8.4 at last check.  Continue to monitor.  Received blood transfusion earlier in the week.    LOS: 0 Staceyann Knouff 9/25/202012:12 PM

## 2019-01-15 NOTE — Progress Notes (Addendum)
Pulmonary [place]   PULMONARY CRITICAL CARE SERVICE  PROGRESS NOTE  Date of Service: 01/15/2019  Stephanie Stark  [name]  DOB: 1941-03-19   DOA: 01/01/2019  Referring Physician: Merton Border, MD  HPI: Stephanie Stark is a 78 y.o. female seen for follow up of Acute on Chronic Respiratory Failure.  Patient is currently on full support on the ventilator has a goal today on pressure support 12/5 and FiO2 28% of 20 hours.  Respiratory therapy is to begin the wean for patient.  Medications: Reviewed on Rounds  Physical Exam:  Vitals: Pulse 102 respirations 30 BP 112/63 O2 sat 99% temp 97.7  Ventilator Settings ventilator mode pressure support 12/5 FiO2 28%  . General: Comfortable at this time . Eyes: Grossly normal lids, irises & conjunctiva . ENT: grossly tongue is normal . Neck: no obvious mass . Cardiovascular: S1 S2 normal no gallop . Respiratory: No rales or rhonchi noted . Abdomen: soft . Skin: no rash seen on limited exam . Musculoskeletal: not rigid . Psychiatric:unable to assess . Neurologic: no seizure no involuntary movements         Lab Data:   Basic Metabolic Panel: Recent Labs  Lab 01/11/19 0718 01/12/19 0520 01/13/19 0544 01/15/19 0537  NA 128*  --  131* 133*  K 3.2* 3.1* 4.0 3.6  CL 93*  --  97* 97*  CO2 24  --  26 26  GLUCOSE 117*  --  109* 138*  BUN 49*  --  32* 32*  CREATININE 2.87*  --  2.42* 2.06*  CALCIUM 8.4*  --  8.3* 8.9  PHOS 1.1*  --  1.3* 1.5*    ABG: No results for input(s): PHART, PCO2ART, PO2ART, HCO3, O2SAT in the last 168 hours.  Liver Function Tests: Recent Labs  Lab 01/11/19 0718 01/13/19 0544 01/15/19 0537  ALBUMIN 1.8* 1.9* 2.0*   No results for input(s): LIPASE, AMYLASE in the last 168 hours. No results for input(s): AMMONIA in the last 168 hours.  CBC: Recent Labs  Lab 01/11/19 0718 01/12/19 0520 01/13/19 0544 01/15/19 0537  WBC 8.2 8.3 8.8 9.6  HGB 7.0*  8.4* 8.7* 8.4*  HCT 22.6* 24.9* 27.1* 26.0*  MCV 91.1 87.4 89.4 89.3  PLT 170 159 161 134*    Cardiac Enzymes: No results for input(s): CKTOTAL, CKMB, CKMBINDEX, TROPONINI in the last 168 hours.  BNP (last 3 results) No results for input(s): BNP in the last 8760 hours.  ProBNP (last 3 results) No results for input(s): PROBNP in the last 8760 hours.  Radiological Exams: No results found.  Assessment/Plan Active Problems:   Acute on chronic respiratory failure with hypoxia (HCC)   Chronic kidney disease, stage IV (severe) (HCC)   Acute lower GI bleeding   Bacterial lobar pneumonia   1. Acute on chronic respiratory failure with hypoxia patient is doing a wean on pressure support mode with a goal of 20 hours. 2. Chronic kidney disease stage IV we will continue with supportive care 3. Acute lower GI bleed patient is at baseline we will continue with present management 4. Bacterial lobar pneumonia treated we will continue with supportive care   I have personally seen and evaluated the patient, evaluated laboratory and imaging results, formulated the assessment and plan and placed orders. The Patient requires high complexity decision making for assessment and support.  Case was discussed on Rounds with the Respiratory Therapy Staff  Allyne Gee, MD [facility] Pulmonary Critical Care Medicine Sleep Medicine

## 2019-01-16 ENCOUNTER — Other Ambulatory Visit (HOSPITAL_COMMUNITY): Payer: Medicare Other

## 2019-01-16 DIAGNOSIS — K922 Gastrointestinal hemorrhage, unspecified: Secondary | ICD-10-CM | POA: Diagnosis not present

## 2019-01-16 DIAGNOSIS — N184 Chronic kidney disease, stage 4 (severe): Secondary | ICD-10-CM | POA: Diagnosis not present

## 2019-01-16 DIAGNOSIS — J159 Unspecified bacterial pneumonia: Secondary | ICD-10-CM | POA: Diagnosis not present

## 2019-01-16 DIAGNOSIS — J9621 Acute and chronic respiratory failure with hypoxia: Secondary | ICD-10-CM | POA: Diagnosis not present

## 2019-01-16 LAB — URINALYSIS, ROUTINE W REFLEX MICROSCOPIC
Bilirubin Urine: NEGATIVE
Glucose, UA: 50 mg/dL — AB
Ketones, ur: 5 mg/dL — AB
Nitrite: NEGATIVE
Protein, ur: >=300 mg/dL — AB
RBC / HPF: >50 RBC/hpf — ABNORMAL HIGH (ref 0–5)
Specific Gravity, Urine: 1.023 (ref 1.005–1.030)
WBC, UA: >50 WBC/hpf — ABNORMAL HIGH (ref 0–5)
pH: 6 (ref 5.0–8.0)

## 2019-01-16 LAB — BLOOD CULTURE ID PANEL (REFLEXED)

## 2019-01-16 NOTE — Progress Notes (Signed)
      INFECTIOUS DISEASE ATTENDING ADDENDUM:   Date: 01/16/2019  Patient name: Stephanie Stark  Medical record number: [id number]  Date of birth: [birth year]   I was alerted to patients presence via [place] group does not do ID consults at [place] but I would highly recommend consult with your ID consultant  I would recommend get rid of HD catheter and line holiday when possible  TTE and consideration of TEE  Ensure source control  Clearance of bacteremia post HD removal  Investigation of metastatic foci    Alcide Evener 01/16/2019, 6:44 PM

## 2019-01-16 NOTE — Progress Notes (Addendum)
Pulmonary [place]   PULMONARY CRITICAL CARE SERVICE  PROGRESS NOTE  Date of Service: 01/16/2019  Stephanie Stark  [name]  DOB: 1940/08/24   DOA: 01/01/2019  Referring Physician: Merton Border, MD  HPI: Stephanie Stark is a 78 y.o. female seen for follow up of Acute on Chronic Respiratory Failure.  Patient is a 16-hour goal today on pressure support 12/5 and FiO2 of 35% so far doing fair respiratory is not confident that the patient will make 6 hours for him to be put back on the ventilator.  Medications: Reviewed on Rounds  Physical Exam:  Vitals: Pulse 103 respirations 23 BP 140s over 48 O2 sat 97% temp 98.4  Ventilator Settings pressure support 12/5 FiO2 35%  . General: Comfortable at this time . Eyes: Grossly normal lids, irises & conjunctiva . ENT: grossly tongue is normal . Neck: no obvious mass . Cardiovascular: S1 S2 normal no gallop . Respiratory: No rales or rhonchi noted . Abdomen: soft . Skin: no rash seen on limited exam . Musculoskeletal: not rigid . Psychiatric:unable to assess . Neurologic: no seizure no involuntary movements         Lab Data:   Basic Metabolic Panel: Recent Labs  Lab 01/11/19 0718 01/12/19 0520 01/13/19 0544 01/15/19 0537  NA 128*  --  131* 133*  K 3.2* 3.1* 4.0 3.6  CL 93*  --  97* 97*  CO2 24  --  26 26  GLUCOSE 117*  --  109* 138*  BUN 49*  --  32* 32*  CREATININE 2.87*  --  2.42* 2.06*  CALCIUM 8.4*  --  8.3* 8.9  PHOS 1.1*  --  1.3* 1.5*    ABG: No results for input(s): PHART, PCO2ART, PO2ART, HCO3, O2SAT in the last 168 hours.  Liver Function Tests: Recent Labs  Lab 01/11/19 0718 01/13/19 0544 01/15/19 0537  ALBUMIN 1.8* 1.9* 2.0*   No results for input(s): LIPASE, AMYLASE in the last 168 hours. No results for input(s): AMMONIA in the last 168 hours.  CBC: Recent Labs  Lab 01/11/19 0718 01/12/19 0520 01/13/19 0544 01/15/19 0537  WBC 8.2 8.3 8.8 9.6  HGB  7.0* 8.4* 8.7* 8.4*  HCT 22.6* 24.9* 27.1* 26.0*  MCV 91.1 87.4 89.4 89.3  PLT 170 159 161 134*    Cardiac Enzymes: No results for input(s): CKTOTAL, CKMB, CKMBINDEX, TROPONINI in the last 168 hours.  BNP (last 3 results) No results for input(s): BNP in the last 8760 hours.  ProBNP (last 3 results) No results for input(s): PROBNP in the last 8760 hours.  Radiological Exams: Dg Chest Port 1 View  Result Date: 01/15/2019 CLINICAL DATA:  Fever. EXAM: PORTABLE CHEST 1 VIEW COMPARISON:  Radiograph of January 02, 2019. FINDINGS: Stable cardiomegaly. Endotracheal tube has been removed and tracheostomy tube is noted in grossly good position. Feeding tube is seen entering stomach. Left internal jugular dialysis catheter is noted with distal tip in expected position of cavoatrial junction. No pneumothorax is noted. Diffuse bilateral lung opacities are noted concerning for edema or pneumonia. Small right pleural effusion is noted. Bony thorax is unremarkable. IMPRESSION: Tracheostomy and feeding tubes in good position. Left internal jugular catheter is noted in grossly good position. Bilateral lung opacities are noted concerning for edema or possibly pneumonia. Small right pleural effusion. Electronically Signed   By: Marijo Conception M.D.   On: 01/15/2019 18:41    Assessment/Plan Active Problems:   Acute on chronic respiratory failure with hypoxia (  [place])   Chronic kidney disease, stage IV (severe) (HCC)   Acute lower GI bleeding   Bacterial lobar pneumonia   1. Acute on chronic respiratory failure with hypoxia patient is doing a wean on pressure support mode with a goal of 16 hours. 2. Chronic kidney disease stage IV we will continue with supportive care 3. Acute lower GI bleed patient is at baseline we will continue with present management 4. Bacterial lobar pneumonia treated we will continue with supportive care   I have personally seen and evaluated the patient, evaluated laboratory and  imaging results, formulated the assessment and plan and placed orders. The Patient requires high complexity decision making for assessment and support.  Case was discussed on Rounds with the Respiratory Therapy Staff  Allyne Gee, MD [facility] Pulmonary Critical Care Medicine Sleep Medicine

## 2019-01-17 DIAGNOSIS — K922 Gastrointestinal hemorrhage, unspecified: Secondary | ICD-10-CM | POA: Diagnosis not present

## 2019-01-17 DIAGNOSIS — J159 Unspecified bacterial pneumonia: Secondary | ICD-10-CM | POA: Diagnosis not present

## 2019-01-17 DIAGNOSIS — J9621 Acute and chronic respiratory failure with hypoxia: Secondary | ICD-10-CM | POA: Diagnosis not present

## 2019-01-17 DIAGNOSIS — N184 Chronic kidney disease, stage 4 (severe): Secondary | ICD-10-CM | POA: Diagnosis not present

## 2019-01-17 LAB — URINE CULTURE: Culture: <10000 — AB

## 2019-01-17 NOTE — Progress Notes (Addendum)
Pulmonary [employer]   PULMONARY CRITICAL CARE SERVICE  PROGRESS NOTE  Date of Service: 01/17/2019  Ronella Meckel  [name]  DOB: [birth year]   DOA: 01/01/2019  Referring Physician: Merton Border, MD  HPI: Stephanie Stark is a 78 y.o. female seen for follow up of Acute on Chronic Respiratory Failure.  Patient has failed pressure support weaning x2 today and remains on full support ventilator assist control mode rate of 18 and FiO2 40%.  Medications: Reviewed on Rounds  Physical Exam:  Vitals: 25 respiration 22 BP 118/60 O2 sat 99% temp 6.9  Ventilator Settings ventilator mode AC VC rate of 18 tidal volume 400 per 5 with FiO2 28%  . General: Comfortable at this time . Eyes: Grossly normal lids, irises & conjunctiva . ENT: grossly tongue is normal . Neck: no obvious mass . Cardiovascular: S1 S2 normal no gallop . Respiratory: Bilateral rhonchi noted . Abdomen: soft . Skin: no rash seen on limited exam . Musculoskeletal: not rigid . Psychiatric:unable to assess . Neurologic: no seizure no involuntary movements         Lab Data:   Basic Metabolic Panel: Recent Labs  Lab 01/11/19 0718 01/12/19 0520 01/13/19 0544 01/15/19 0537  NA 128*  --  131* 133*  K 3.2* 3.1* 4.0 3.6  CL 93*  --  97* 97*  CO2 24  --  26 26  GLUCOSE 117*  --  109* 138*  BUN 49*  --  32* 32*  CREATININE 2.87*  --  2.42* 2.06*  CALCIUM 8.4*  --  8.3* 8.9  PHOS 1.1*  --  1.3* 1.5*    ABG: No results for input(s): PHART, PCO2ART, PO2ART, HCO3, O2SAT in the last 168 hours.  Liver Function Tests: Recent Labs  Lab 01/11/19 0718 01/13/19 0544 01/15/19 0537  ALBUMIN 1.8* 1.9* 2.0*   No results for input(s): LIPASE, AMYLASE in the last 168 hours. No results for input(s): AMMONIA in the last 168 hours.  CBC: Recent Labs  Lab 01/11/19 0718 01/12/19 0520 01/13/19 0544 01/15/19 0537  WBC 8.2 8.3 8.8 9.6  HGB 7.0* 8.4* 8.7* 8.4*  HCT 22.6* 24.9*  27.1* 26.0*  MCV 91.1 87.4 89.4 89.3  PLT 170 159 161 134*    Cardiac Enzymes: No results for input(s): CKTOTAL, CKMB, CKMBINDEX, TROPONINI in the last 168 hours.  BNP (last 3 results) No results for input(s): BNP in the last 8760 hours.  ProBNP (last 3 results) No results for input(s): PROBNP in the last 8760 hours.  Radiological Exams: Ct Abdomen Pelvis Wo Contrast  Result Date: 01/16/2019 CLINICAL DATA:  Evaluate for appendicitis.  Diffuse abdominal pain EXAM: CT ABDOMEN AND PELVIS WITHOUT CONTRAST TECHNIQUE: Multidetector CT imaging of the abdomen and pelvis was performed following the standard protocol without IV contrast. COMPARISON:  None FINDINGS: Lower chest: Large right pleural effusion and moderate left effusion identified. Patchy airspace densities are identified within the imaged portions of both lungs. Hepatobiliary: No focal liver abnormality is seen. Status post cholecystectomy. No biliary dilatation. Pancreas: Unremarkable. No pancreatic ductal dilatation or surrounding inflammatory changes. Spleen: Normal in size without focal abnormality. Adrenals/Urinary Tract: Adrenal glands are unremarkable. Kidneys are normal, without renal calculi, focal lesion, or hydronephrosis. Bladder is unremarkable. Foley catheter in situ. Stomach/Bowel: Marked diffuse gastric wall edema is identified. Feeding tube tip is in the gastric antrum. Several endo clips are identified within the descending duodenum. There is of foci of high attenuation material along the course of the  descending duodenum which may reflect enteric contrast material within a duodenal diverticulum. No abnormal distension of the small or large bowel loops. The appendix is visualized and appears normal. Scattered colonic diverticula noted. No colonic wall thickening or inflammation. Vascular/Lymphatic: Aortic atherosclerosis. No aneurysm. No abdominopelvic adenopathy. Reproductive: Status post hysterectomy. No adnexal masses.  Other: Marked diffuse body wall edema. Trace ascites identified within the abdomen and pelvis. No focal fluid collections. No free intraperitoneal air. Musculoskeletal: Previous open fixation of right hip fracture. 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 IMPRESSION: 1. No evidence for acute appendicitis. 2. Diffuse edema throughout the wall of the stomach. Feeding tube is identified within the gastric antrum. 3. Marked diffuse body wall edema, bilateral pleural effusions and trace ascites. 4.  Aortic Atherosclerosis (ICD10-I70.0). Electronically Signed   By: Kerby Moors M.D.   On: 01/16/2019 18:02   Dg Chest Port 1 View  Result Date: 01/15/2019 CLINICAL DATA:  Fever. EXAM: PORTABLE CHEST 1 VIEW COMPARISON:  Radiograph of January 02, 2019. FINDINGS: Stable cardiomegaly. Endotracheal tube has been removed and tracheostomy tube is noted in grossly good position. Feeding tube is seen entering stomach. Left internal jugular dialysis catheter is noted with distal tip in expected position of cavoatrial junction. No pneumothorax is noted. Diffuse bilateral lung opacities are noted concerning for edema or pneumonia. Small right pleural effusion is noted. Bony thorax is unremarkable. IMPRESSION: Tracheostomy and feeding tubes in good position. Left internal jugular catheter is noted in grossly good position. Bilateral lung opacities are noted concerning for edema or possibly pneumonia. Small right pleural effusion. Electronically Signed   By: Marijo Conception M.D.   On: 01/15/2019 18:41    Assessment/Plan Active Problems:   Acute on chronic respiratory failure with hypoxia (HCC)   Chronic kidney disease, stage IV (severe) (HCC)   Acute lower GI bleeding   Bacterial lobar pneumonia   1. Acute on chronic respiratory failure with hypoxia patient will continue to attempt weaning right now unable to wean and remains on full support ventilator we will continue  supportive measures and pulmonary toilet. 2. Chronic kidney disease stage IV we will continue with supportive care 3. Acute lower GI bleed patient is at baseline we will continue with present management 4. Bacterial lobar pneumonia treated we will continue with supportive care   I have personally seen and evaluated the patient, evaluated laboratory and imaging results, formulated the assessment and plan and placed orders. The Patient requires high complexity decision making for assessment and support.  Case was discussed on Rounds with the Respiratory Therapy Staff  Allyne Gee, MD [facility] Pulmonary Critical Care Medicine Sleep Medicine

## 2019-01-18 ENCOUNTER — Other Ambulatory Visit (HOSPITAL_COMMUNITY): Payer: Medicare Other

## 2019-01-18 ENCOUNTER — Encounter (HOSPITAL_COMMUNITY): Payer: Self-pay | Admitting: Interventional Radiology

## 2019-01-18 DIAGNOSIS — J9621 Acute and chronic respiratory failure with hypoxia: Secondary | ICD-10-CM | POA: Diagnosis not present

## 2019-01-18 DIAGNOSIS — N184 Chronic kidney disease, stage 4 (severe): Secondary | ICD-10-CM | POA: Diagnosis not present

## 2019-01-18 DIAGNOSIS — J159 Unspecified bacterial pneumonia: Secondary | ICD-10-CM | POA: Diagnosis not present

## 2019-01-18 DIAGNOSIS — K922 Gastrointestinal hemorrhage, unspecified: Secondary | ICD-10-CM | POA: Diagnosis not present

## 2019-01-18 HISTORY — PX: IR US GUIDE VASC ACCESS RIGHT: IMG2390

## 2019-01-18 HISTORY — PX: IR FLUORO GUIDE CV LINE RIGHT: IMG2283

## 2019-01-18 LAB — CBC
HCT: 26.2 % — ABNORMAL LOW (ref 36.0–46.0)
Hemoglobin: 8.2 g/dL — ABNORMAL LOW (ref 12.0–15.0)
MCH: 27.7 pg (ref 26.0–34.0)
MCHC: 31.3 g/dL (ref 30.0–36.0)
MCV: 88.5 fL (ref 80.0–100.0)
Platelets: 167 10*3/uL (ref 150–400)
RBC: 2.96 MIL/uL — ABNORMAL LOW (ref 3.87–5.11)
RDW: 15.6 % — ABNORMAL HIGH (ref 11.5–15.5)
WBC: 9.7 10*3/uL (ref 4.0–10.5)
nRBC: 0 % (ref 0.0–0.2)

## 2019-01-18 LAB — RENAL FUNCTION PANEL
Albumin: 2.2 g/dL — ABNORMAL LOW (ref 3.5–5.0)
Anion gap: 8 (ref 5–15)
BUN: 46 mg/dL — ABNORMAL HIGH (ref 8–23)
CO2: 26 mmol/L (ref 22–32)
Calcium: 8.9 mg/dL (ref 8.9–10.3)
Chloride: 100 mmol/L (ref 98–111)
Creatinine, Ser: 2.77 mg/dL — ABNORMAL HIGH (ref 0.44–1.00)
GFR calc Af Amer: 18 mL/min — ABNORMAL LOW (ref >60)
GFR calc non Af Amer: 16 mL/min — ABNORMAL LOW (ref >60)
Glucose, Bld: 123 mg/dL — ABNORMAL HIGH (ref 70–99)
Phosphorus: 1.9 mg/dL — ABNORMAL LOW (ref 2.5–4.6)
Potassium: 3.7 mmol/L (ref 3.5–5.1)
Sodium: 134 mmol/L — ABNORMAL LOW (ref 135–145)

## 2019-01-18 LAB — CULTURE, RESPIRATORY W GRAM STAIN

## 2019-01-18 LAB — VANCOMYCIN, TROUGH: Vancomycin Tr: 13 ug/mL — ABNORMAL LOW (ref 15–20)

## 2019-01-18 MED ORDER — LIDOCAINE HCL (PF) 1 % IJ SOLN
INTRAMUSCULAR | Status: DC | PRN
  Administered 2019-01-18: 5 mL

## 2019-01-18 MED ORDER — HEPARIN SODIUM (PORCINE) 1000 UNIT/ML IJ SOLN
INTRAMUSCULAR | Status: AC
  Filled 2019-01-18: qty 1

## 2019-01-18 MED ORDER — LIDOCAINE HCL 1 % IJ SOLN
INTRAMUSCULAR | Status: AC
  Filled 2019-01-18: qty 20

## 2019-01-18 NOTE — Progress Notes (Signed)
Attempted to bring pt down for temp cath. Per nurse no respiratory therapist available at this time to bring pt down due to code on floor. Will call back in 30 min to see if therapist available to come down by 4p.

## 2019-01-18 NOTE — Procedures (Signed)
Interventional Radiology Procedure Note  Procedure: Placement of a right IJ approach temp HD catheter.  Tip is positioned at the superior cavoatrial junction and catheter is ready for immediate use.  Complications: None Recommendations:  - Ok to use - Do not submerge - Routine line care   Signed,  Tobie Hellen S. Vanassa Penniman, DO    

## 2019-01-18 NOTE — Progress Notes (Addendum)
Pulmonary [place]   PULMONARY CRITICAL CARE SERVICE  PROGRESS NOTE  Date of Service: 01/18/2019  Stephanie Stark  [name]  DOB: September 23, 1940   DOA: 01/01/2019  Referring Physician: Merton Border, MD  HPI: Stephanie Stark is a 78 y.o. female seen for follow up of Acute on Chronic Respiratory Failure. Patient has a 16-hour goal today on pressure support 12/5 with FiO2 28% currently satting well no distress.  Medications: Reviewed on Rounds  Physical Exam:  Vitals: Pulse 100 respirations 19 BP 133/83 O2 sat 99% temp 98.9  Ventilator Settings pressure 12/5 FiO2 28%  . General: Comfortable at this time . Eyes: Grossly normal lids, irises & conjunctiva . ENT: grossly tongue is normal . Neck: no obvious mass . Cardiovascular: S1 S2 normal no gallop . Respiratory: Coarse breath sounds . Abdomen: soft . Skin: no rash seen on limited exam . Musculoskeletal: not rigid . Psychiatric:unable to assess . Neurologic: no seizure no involuntary movements         Lab Data:   Basic Metabolic Panel: Recent Labs  Lab 01/12/19 0520 01/13/19 0544 01/15/19 0537 01/18/19 0936  NA  --  131* 133* 134*  K 3.1* 4.0 3.6 3.7  CL  --  97* 97* 100  CO2  --  26 26 26   GLUCOSE  --  109* 138* 123*  BUN  --  32* 32* 46*  CREATININE  --  2.42* 2.06* 2.77*  CALCIUM  --  8.3* 8.9 8.9  PHOS  --  1.3* 1.5* 1.9*    ABG: No results for input(s): PHART, PCO2ART, PO2ART, HCO3, O2SAT in the last 168 hours.  Liver Function Tests: Recent Labs  Lab 01/13/19 0544 01/15/19 0537 01/18/19 0936  ALBUMIN 1.9* 2.0* 2.2*   No results for input(s): LIPASE, AMYLASE in the last 168 hours. No results for input(s): AMMONIA in the last 168 hours.  CBC: Recent Labs  Lab 01/12/19 0520 01/13/19 0544 01/15/19 0537 01/18/19 0936  WBC 8.3 8.8 9.6 9.7  HGB 8.4* 8.7* 8.4* 8.2*  HCT 24.9* 27.1* 26.0* 26.2*  MCV 87.4 89.4 89.3 88.5  PLT 159 161 134* 167     Cardiac Enzymes: No results for input(s): CKTOTAL, CKMB, CKMBINDEX, TROPONINI in the last 168 hours.  BNP (last 3 results) No results for input(s): BNP in the last 8760 hours.  ProBNP (last 3 results) No results for input(s): PROBNP in the last 8760 hours.  Radiological Exams: Ct Abdomen Pelvis Wo Contrast  Result Date: 01/16/2019 CLINICAL DATA:  Evaluate for appendicitis.  Diffuse abdominal pain EXAM: CT ABDOMEN AND PELVIS WITHOUT CONTRAST TECHNIQUE: Multidetector CT imaging of the abdomen and pelvis was performed following the standard protocol without IV contrast. COMPARISON:  None FINDINGS: Lower chest: Large right pleural effusion and moderate left effusion identified. Patchy airspace densities are identified within the imaged portions of both lungs. Hepatobiliary: No focal liver abnormality is seen. Status post cholecystectomy. No biliary dilatation. Pancreas: Unremarkable. No pancreatic ductal dilatation or surrounding inflammatory changes. Spleen: Normal in size without focal abnormality. Adrenals/Urinary Tract: Adrenal glands are unremarkable. Kidneys are normal, without renal calculi, focal lesion, or hydronephrosis. Bladder is unremarkable. Foley catheter in situ. Stomach/Bowel: Marked diffuse gastric wall edema is identified. Feeding tube tip is in the gastric antrum. Several endo clips are identified within the descending duodenum. There is of foci of high attenuation material along the course of the descending duodenum which may reflect enteric contrast material within a duodenal diverticulum. No abnormal distension of  the small or large bowel loops. The appendix is visualized and appears normal. Scattered colonic diverticula noted. No colonic wall thickening or inflammation. Vascular/Lymphatic: Aortic atherosclerosis. No aneurysm. No abdominopelvic adenopathy. Reproductive: Status post hysterectomy. No adnexal masses. Other: Marked diffuse body wall edema. Trace ascites identified  within the abdomen and pelvis. No focal fluid collections. No free intraperitoneal air. Musculoskeletal: Previous open fixation of right hip fracture. 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 IMPRESSION: 1. No evidence for acute appendicitis. 2. Diffuse edema throughout the wall of the stomach. Feeding tube is identified within the gastric antrum. 3. Marked diffuse body wall edema, bilateral pleural effusions and trace ascites. 4.  Aortic Atherosclerosis (ICD10-I70.0). Electronically Signed   By: Kerby Moors M.D.   On: 01/16/2019 18:02    Assessment/Plan Active Problems:   Acute on chronic respiratory failure with hypoxia (HCC)   Chronic kidney disease, stage IV (severe) (HCC)   Acute lower GI bleeding   Bacterial lobar pneumonia   1. Acute on chronic respiratory failure with hypoxia patient is weaning today for 16 hours on pressure support currently doing well.  Continue pulmonary toilet 2. Chronic kidney disease stage IV we will continue with supportive care 3. Acute lower GI bleed patient is at baseline we will continue with present management 4. Bacterial lobar pneumonia treated we will continue with supportive care    I have personally seen and evaluated the patient, evaluated laboratory and imaging results, formulated the assessment and plan and placed orders. The Patient requires high complexity decision making for assessment and support.  Case was discussed on Rounds with the Respiratory Therapy Staff  Allyne Gee, MD [facility] Pulmonary Critical Care Medicine Sleep Medicine

## 2019-01-18 NOTE — Progress Notes (Signed)
[facility]  ROUNDING NOTE   Subjective:  Patient seen at bedside. Remains critically ill. Dialysis catheter removed over the weekend as Staphylococcus aureus was found on blood culture.   Objective:  Vital signs in last 24 hours:  Temperature 98.9 pulse 106 respirations 18 blood pressure 173/75   Physical Exam: General: Critically ill-appearing  Head: Endotracheal tube noted to be in place NG in place  Eyes: Anicteric  Neck: Supple, trachea midline  Lungs:  Scattered rhonchi rales, vent assisted  Heart: S1S2 no rubs  Abdomen:  Soft, mild distension  Extremities: 2+ peripheral edema.  Neurologic: Awake, not following commands  Skin: Weeping skin from both upper extremities  Access: Left IJ temporary dialysis catheter removed    Basic Metabolic Panel: Recent Labs  Lab 01/12/19 0520 01/13/19 0544 01/15/19 0537  NA  --  131* 133*  K 3.1* 4.0 3.6  CL  --  97* 97*  CO2  --  26 26  GLUCOSE  --  109* 138*  BUN  --  32* 32*  CREATININE  --  2.42* 2.06*  CALCIUM  --  8.3* 8.9  PHOS  --  1.3* 1.5*    Liver Function Tests: Recent Labs  Lab 01/13/19 0544 01/15/19 0537  ALBUMIN 1.9* 2.0*   No results for input(s): LIPASE, AMYLASE in the last 168 hours. No results for input(s): AMMONIA in the last 168 hours.  CBC: Recent Labs  Lab 01/12/19 0520 01/13/19 0544 01/15/19 0537  WBC 8.3 8.8 9.6  HGB 8.4* 8.7* 8.4*  HCT 24.9* 27.1* 26.0*  MCV 87.4 89.4 89.3  PLT 159 161 134*    Cardiac Enzymes: No results for input(s): CKTOTAL, CKMB, CKMBINDEX, TROPONINI in the last 168 hours.  BNP: Invalid input(s): POCBNP  CBG: No results for input(s): GLUCAP in the last 168 hours.  Microbiology: Results for orders placed or performed during the hospital encounter of 01/12/19  SARS CORONAVIRUS 2 (TAT 6-24 HRS) Nasopharyngeal Nasopharyngeal Swab     Status: None   Collection Time: 01/11/19  2:45 PM   Specimen: Nasopharyngeal Swab  Result Value Ref Range  Status   SARS Coronavirus 2 NEGATIVE NEGATIVE Final    Comment: (NOTE) SARS-CoV-2 target nucleic acids are NOT DETECTED. The SARS-CoV-2 RNA is generally detectable in upper and lower respiratory specimens during the acute phase of infection. Negative results do not preclude SARS-CoV-2 infection, do not rule out co-infections with other pathogens, and should not be used as the sole basis for treatment or other patient management decisions. Negative results must be combined with clinical observations, patient history, and epidemiological information. The expected result is Negative. Fact Sheet for Patients: SugarRoll.be Fact Sheet for Healthcare Providers: https://www.woods-mathews.com/ This test is not yet approved or cleared by the [place] FDA and  has been authorized for detection and/or diagnosis of SARS-CoV-2 by FDA under an Emergency Use Authorization (EUA). This EUA will remain  in effect (meaning this test can be used) for the duration of the COVID-19 declaration under Section 56 4(b)(1) of the Act, 21 U.S.C. section 360bbb-3(b)(1), unless the authorization is terminated or revoked sooner. Performed at [facility], [place] [address], [place], [place] [address]     Coagulation Studies: No results for input(s): LABPROT, INR in the last 72 hours.  Urinalysis: Recent Labs    01/16/19 1620  COLORURINE AMBER*  LABSPEC 1.023  PHURINE 6.0  GLUCOSEU 50*  HGBUR MODERATE*  BILIRUBINUR NEGATIVE  KETONESUR 5*  PROTEINUR >=300*  NITRITE NEGATIVE  LEUKOCYTESUR SMALL*      Imaging: Ct Abdomen Pelvis Wo Contrast  Result Date: 01/16/2019 CLINICAL DATA:  Evaluate for appendicitis.  Diffuse abdominal pain EXAM: CT ABDOMEN AND PELVIS WITHOUT CONTRAST TECHNIQUE: Multidetector CT imaging of the abdomen and pelvis was performed following the standard protocol without IV contrast. COMPARISON:  None FINDINGS: Lower chest: Large right  pleural effusion and moderate left effusion identified. Patchy airspace densities are identified within the imaged portions of both lungs. Hepatobiliary: No focal liver abnormality is seen. Status post cholecystectomy. No biliary dilatation. Pancreas: Unremarkable. No pancreatic ductal dilatation or surrounding inflammatory changes. Spleen: Normal in size without focal abnormality. Adrenals/Urinary Tract: Adrenal glands are unremarkable. Kidneys are normal, without renal calculi, focal lesion, or hydronephrosis. Bladder is unremarkable. Foley catheter in situ. Stomach/Bowel: Marked diffuse gastric wall edema is identified. Feeding tube tip is in the gastric antrum. Several endo clips are identified within the descending duodenum. There is of foci of high attenuation material along the course of the descending duodenum which may reflect enteric contrast material within a duodenal diverticulum. No abnormal distension of the small or large bowel loops. The appendix is visualized and appears normal. Scattered colonic diverticula noted. No colonic wall thickening or inflammation. Vascular/Lymphatic: Aortic atherosclerosis. No aneurysm. No abdominopelvic adenopathy. Reproductive: Status post hysterectomy. No adnexal masses. Other: Marked diffuse body wall edema. Trace ascites identified within the abdomen and pelvis. No focal fluid collections. No free intraperitoneal air. Musculoskeletal: Previous open fixation of right hip fracture. 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 IMPRESSION: 1. No evidence for acute appendicitis. 2. Diffuse edema throughout the wall of the stomach. Feeding tube is identified within the gastric antrum. 3. Marked diffuse body wall edema, bilateral pleural effusions and trace ascites. 4.  Aortic Atherosclerosis (ICD10-I70.0). Electronically Signed   By: Kerby Moors M.D.   On: 01/16/2019 18:02     Medications:     lidocaine  (PF)  Assessment/ Plan:  78 y.o. female with a PMHx of congestive heart failure, rheumatoid arthritis, chronic kidney disease stage IV, hyperlipidemia, fibromyalgia, giant cell arteritis, hypertension, thoracic ascending aortic aneurysm, duodenal ulcers, severe diverticulosis, coronary artery disease, hypothyroidism, who was admitted to [facility] on 01/01/2019 for ongoing treatment of acute respiratory failure, generalized debility, and severe renal dysfunction.   1.  Acute renal failure/chronic kidney disease stage IV.  Temporary dialysis catheter was removed over the weekend given staph aureus growing in the blood.  Antibiotic management as per hospitalist.  Catheter to be replaced most likely on Tuesday.  2.  Acute respiratory failure.  Continue current ventilatory support.  3.  Anemia chronic kidney disease.  Recommend periodic monitoring of CBC.  She previously received blood transfusion.    LOS: 0 Kerston Landeck 9/28/20208:34 AM

## 2019-01-19 DIAGNOSIS — J9621 Acute and chronic respiratory failure with hypoxia: Secondary | ICD-10-CM | POA: Diagnosis not present

## 2019-01-19 DIAGNOSIS — K922 Gastrointestinal hemorrhage, unspecified: Secondary | ICD-10-CM | POA: Diagnosis not present

## 2019-01-19 DIAGNOSIS — N184 Chronic kidney disease, stage 4 (severe): Secondary | ICD-10-CM | POA: Diagnosis not present

## 2019-01-19 DIAGNOSIS — J159 Unspecified bacterial pneumonia: Secondary | ICD-10-CM | POA: Diagnosis not present

## 2019-01-19 LAB — CULTURE, BLOOD (ROUTINE X 2): Special Requests: ADEQUATE

## 2019-01-19 NOTE — Progress Notes (Signed)
Pulmonary [place]   PULMONARY CRITICAL CARE SERVICE  PROGRESS NOTE  Date of Service: 01/19/2019  Brandan Glauber  TOI:[id number]  DOB: August 13, 1940   DOA: 01/01/2019  Referring Physician: Merton Border, MD  HPI: Stephanie Stark is a 78 y.o. female seen for follow up of Acute on Chronic Respiratory Failure.  Patient currently is on full support on assist control mode has been on 28% FiO2 this morning was tried on T collar however did not pass so was placed back on the ventilator  Medications: Reviewed on Rounds  Physical Exam:  Vitals: Temperature 99.9 pulse 78 respiratory 21 blood pressure 102/62 saturations 100%  Ventilator Settings mode ventilation assist control FiO2 28% tidal volume 434 PEEP 5  . General: Comfortable at this time . Eyes: Grossly normal lids, irises & conjunctiva . ENT: grossly tongue is normal . Neck: no obvious mass . Cardiovascular: S1 S2 normal no gallop . Respiratory: Coarse breath sounds with a few scattered rhonchi . Abdomen: soft . Skin: no rash seen on limited exam . Musculoskeletal: not rigid . Psychiatric:unable to assess . Neurologic: no seizure no involuntary movements         Lab Data:   Basic Metabolic Panel: Recent Labs  Lab 01/13/19 0544 01/15/19 0537 01/18/19 0936  NA 131* 133* 134*  K 4.0 3.6 3.7  CL 97* 97* 100  CO2 '26 26 26  '$ GLUCOSE 109* 138* 123*  BUN 32* 32* 46*  CREATININE 2.42* 2.06* 2.77*  CALCIUM 8.3* 8.9 8.9  PHOS 1.3* 1.5* 1.9*    ABG: No results for input(s): PHART, PCO2ART, PO2ART, HCO3, O2SAT in the last 168 hours.  Liver Function Tests: Recent Labs  Lab 01/13/19 0544 01/15/19 0537 01/18/19 0936  ALBUMIN 1.9* 2.0* 2.2*   No results for input(s): LIPASE, AMYLASE in the last 168 hours. No results for input(s): AMMONIA in the last 168 hours.  CBC: Recent Labs  Lab 01/13/19 0544 01/15/19 0537 01/18/19 0936  WBC 8.8 9.6 9.7  HGB 8.7* 8.4* 8.2*  HCT 27.1*  26.0* 26.2*  MCV 89.4 89.3 88.5  PLT 161 134* 167    Cardiac Enzymes: No results for input(s): CKTOTAL, CKMB, CKMBINDEX, TROPONINI in the last 168 hours.  BNP (last 3 results) No results for input(s): BNP in the last 8760 hours.  ProBNP (last 3 results) No results for input(s): PROBNP in the last 8760 hours.  Radiological Exams: Ir Cyndy Freeze Guide Cv Line Right  Result Date: 01/18/2019 INDICATION: 78 year old female with renal injury, referred for hemodialysis catheter placement EXAM: IMAGE GUIDED HEMODIALYSIS CATHETER PLACEMENT MEDICATIONS: None ANESTHESIA/SEDATION: None FLUOROSCOPY TIME:  Fluoroscopy Time: 0 minutes 12 seconds COMPLICATIONS: None PROCEDURE: Informed written consent was obtained from the patient's family after a discussion of the risks, benefits, and alternatives to treatment. Questions regarding the procedure were encouraged and answered. The right neck was prepped with chlorhexidine in a sterile fashion, and a sterile drape was applied covering the operative field. Maximum barrier sterile technique with sterile gowns and gloves were used for the procedure. A timeout was performed prior to the initiation of the procedure. A micropuncture kit was utilized to access the right internal jugular vein under direct, real-time ultrasound guidance after the overlying soft tissues were anesthetized with 1% lidocaine with epinephrine. Ultrasound image documentation was performed. The microwire was kinked to measure appropriate catheter length. A stiff glidewire was advanced to the level of the IVC. A 15 cm hemodialysis catheter was then placed over the wire. Final catheter  positioning was confirmed and documented with a spot radiographic image. The catheter aspirates and flushes normally. The catheter was flushed with appropriate volume heparin dwells. Dressings were applied. The patient tolerated the procedure well without immediate post procedural complication. IMPRESSION: Status post image  guided placement of right IJ temporary hemodialysis catheter. Signed, Dulcy Fanny. Dellia Nims, RPVI [facility] Radiology Electronically Signed   By: Corrie Mckusick D.O.   On: 01/18/2019 17:04   Ir US Guide Vasc Access Right  Result Date: 01/18/2019 INDICATION: 78 year old female with renal injury, referred for hemodialysis catheter placement EXAM: IMAGE GUIDED HEMODIALYSIS CATHETER PLACEMENT MEDICATIONS: None ANESTHESIA/SEDATION: None FLUOROSCOPY TIME:  Fluoroscopy Time: 0 minutes 12 seconds COMPLICATIONS: None PROCEDURE: Informed written consent was obtained from the patient's family after a discussion of the risks, benefits, and alternatives to treatment. Questions regarding the procedure were encouraged and answered. The right neck was prepped with chlorhexidine in a sterile fashion, and a sterile drape was applied covering the operative field. Maximum barrier sterile technique with sterile gowns and gloves were used for the procedure. A timeout was performed prior to the initiation of the procedure. A micropuncture kit was utilized to access the right internal jugular vein under direct, real-time ultrasound guidance after the overlying soft tissues were anesthetized with 1% lidocaine with epinephrine. Ultrasound image documentation was performed. The microwire was kinked to measure appropriate catheter length. A stiff glidewire was advanced to the level of the IVC. A 15 cm hemodialysis catheter was then placed over the wire. Final catheter positioning was confirmed and documented with a spot radiographic image. The catheter aspirates and flushes normally. The catheter was flushed with appropriate volume heparin dwells. Dressings were applied. The patient tolerated the procedure well without immediate post procedural complication. IMPRESSION: Status post image guided placement of right IJ temporary hemodialysis catheter. Signed, Dulcy Fanny. Dellia Nims, RPVI [facility] Radiology Electronically Signed   By: Corrie Mckusick D.O.   On: 01/18/2019 17:04    Assessment/Plan Active Problems:   Acute on chronic respiratory failure with hypoxia (HCC)   Chronic kidney disease, stage IV (severe) (HCC)   Acute lower GI bleeding   Bacterial lobar pneumonia   1. Acute on chronic respiratory failure with hypoxia we will continue with full support on the vent patient was attempted at weaning this morning which the patient did not tolerate 2. Chronic kidney disease stage IV followed by nephrology for dialysis continue supportive care 3. Acute GI bleed follow labs 4. Bacterial pneumonia treated slow to improve we will continue to monitor   I have personally seen and evaluated the patient, evaluated laboratory and imaging results, formulated the assessment and plan and placed orders. The Patient requires high complexity decision making for assessment and support.  Case was discussed on Rounds with the Respiratory Therapy Staff  Allyne Gee, MD [facility] Pulmonary Critical Care Medicine Sleep Medicine

## 2019-01-20 DIAGNOSIS — J159 Unspecified bacterial pneumonia: Secondary | ICD-10-CM | POA: Diagnosis not present

## 2019-01-20 DIAGNOSIS — N184 Chronic kidney disease, stage 4 (severe): Secondary | ICD-10-CM | POA: Diagnosis not present

## 2019-01-20 DIAGNOSIS — R7881 Bacteremia: Secondary | ICD-10-CM

## 2019-01-20 DIAGNOSIS — K922 Gastrointestinal hemorrhage, unspecified: Secondary | ICD-10-CM | POA: Diagnosis not present

## 2019-01-20 DIAGNOSIS — J9621 Acute and chronic respiratory failure with hypoxia: Secondary | ICD-10-CM | POA: Diagnosis not present

## 2019-01-20 LAB — VANCOMYCIN, TROUGH: Vancomycin Tr: 16 ug/mL (ref 15–20)

## 2019-01-20 LAB — CBC
HCT: 21.9 % — ABNORMAL LOW (ref 36.0–46.0)
Hemoglobin: 7.1 g/dL — ABNORMAL LOW (ref 12.0–15.0)
MCH: 28.5 pg (ref 26.0–34.0)
MCHC: 32.4 g/dL (ref 30.0–36.0)
MCV: 88 fL (ref 80.0–100.0)
Platelets: 136 10*3/uL — ABNORMAL LOW (ref 150–400)
RBC: 2.49 MIL/uL — ABNORMAL LOW (ref 3.87–5.11)
RDW: 15.5 % (ref 11.5–15.5)
WBC: 7.8 10*3/uL (ref 4.0–10.5)
nRBC: 0 % (ref 0.0–0.2)

## 2019-01-20 LAB — RENAL FUNCTION PANEL
Albumin: 2 g/dL — ABNORMAL LOW (ref 3.5–5.0)
Anion gap: 10 (ref 5–15)
BUN: 69 mg/dL — ABNORMAL HIGH (ref 8–23)
CO2: 23 mmol/L (ref 22–32)
Calcium: 8.7 mg/dL — ABNORMAL LOW (ref 8.9–10.3)
Chloride: 99 mmol/L (ref 98–111)
Creatinine, Ser: 3.65 mg/dL — ABNORMAL HIGH (ref 0.44–1.00)
GFR calc Af Amer: 13 mL/min — ABNORMAL LOW (ref >60)
GFR calc non Af Amer: 11 mL/min — ABNORMAL LOW (ref >60)
Glucose, Bld: 115 mg/dL — ABNORMAL HIGH (ref 70–99)
Phosphorus: 2.7 mg/dL (ref 2.5–4.6)
Potassium: 3.8 mmol/L (ref 3.5–5.1)
Sodium: 132 mmol/L — ABNORMAL LOW (ref 135–145)

## 2019-01-20 NOTE — Progress Notes (Signed)
PROGRESS NOTE    Reve Crocket  BOF:[id number] DOB: Apr 18, 1941 DOA: 01/01/2019   Brief Narrative:  Nuriyah Hanline is an 78 y.o. female who was admitted to [facility] on 01/01/2019.  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  She was recently discharged from another hospital for upper and lower GI bleed and received multiple blood transfusions prior to this admission.  During that admission patient underwent EGD and colonoscopy which showed anterior duodenal bulb ulcer with visible vessel.  Colonoscopy on 11/27/2018 demonstrated moderate diverticulosis through the entire colon with severe diverticulosis in the sigmoid colon.  Patient was treated with Protonix twice daily and soft diet.  During that admission she received multiple PRBCs transfusion for low hemoglobin.  Patient was also intubated and started on TPN.  She was noted to have pancytopenia with suspicious DIC.  Oncology was consulted.  Nephrology was also consulted due to acute kidney injury and the patient continued to have a low urine output.  She was started on Lasix drip with minimal improvement of urine production.  Patient hospital stay was complicated by pneumonia.  She was initially started on IV vancomycin, Zosyn.  Sputum cultures showed MRSA.  Zosyn was discontinued with  antibiotic coverage with vancomycin.  She continued to have low urine output, Lasix drip increased by nephrology.  After multiple attempts of spontaneous breathing trial patient was extubated.  However, had to be reintubated due to respiratory distress.  [facility] and care.    Patient this time continues to be on the ventilator.  She was on treatment with  ciprofloxacin for ventilator associated pneumonia with Citrobacter.  She also was found to have stool C. difficile positive therefore started on p.o. vancomycin.  She now has bacteremia with MRSA on blood cultures collected on 01/15/2019.  On IV vancomycin, empiric meropenem.  Assessment & Plan:   Active Problems:   Acute on chronic respiratory failure with hypoxia (HCC)   Chronic kidney disease, stage IV (severe) (HCC)   Acute lower GI bleeding   Bacterial lobar pneumonia MRSA bacteremia Pneumonia with MRSA  Acute on chronic respiratory failure with hypoxemia: Ventilator dependent.  Last chest x-ray done on 01/15/2019 per report bilateral lung opacities concerning for edema or possibly pneumonia, small right pleural effusion. Previous respiratory cultures showed Citrobacter.  She received treatment with ciprofloxacin.  Repeat respiratory cultures collected on 01/15/2019 showed MRSA.  Currently on treatment with IV vancomycin, meropenem.  Do not think she needs the meropenem at this time.  Would recommend to discontinue the meropenem and continue with IV vancomycin. Pulmonary following.  MRSA bacteremia: She also had blood cultures collected on 01/15/2019 which showed MRSA.  Currently has a temporary dialysis line.  She has been on treatment with IV vancomycin, empiric meropenem.  Since no gram negatives on the sputum or in the blood cultures will discontinue the meropenem.  Continue IV vancomycin.  Repeat blood cultures from 01/18/2019 did not show any growth in 2 days.  She will need to be treated with 2 weeks of IV vancomycin from the date of negative blood cultures.  Antibiotics renally dosed.  Acute on chronic renal failure: Patient has stage IV CKD.  Currently on dialysis per nephrology.  She presently has a temporary dialysis catheter.  On Monday/Wednesday/Friday schedule  as per nephrology.  She continues to remain oliguric. Antibiotics renally dosed.  C. difficile colitis: Continue p.o.  vancomycin.  Would recommend to continue treatment while on the antibiotics and also for 1 week after stopping the antibiotics.  Severe cutaneous candidiasis: Treated with 5 days of fluconazole.  Continue topical antifungal.  GI bleed: Management per the primary team.  UTI: Urine culture previously showed yeast.  Treated with Fluconazole as mentioned above.  Repeat urine cultures from 01/15/2019 showed less than 10,000 colonies per mL insignificant growth.  Dysphagia: Due to her dysphagia she is very high risk for aspiration and worsening respiratory failure from aspiration.  Due to her complex medical problems she is a very high risk for worsening and decompensation.  Subjective: She continues to be on the ventilator.  She is opening eyes but not following commands at this time.  Objective: Vitals: Temperature 99.3, pulse 108, respiratory rate 28, blood pressure 142/75  Examination: General exam: Ill-appearing female, opening eyes but not following commands at this time. HEENT: Atraumatic, normocephalic, pupils equal and reactive, no ear or nose lesions, has NG tube Neck: Has trach in place Respiratory system: Scattered rhonchi, no wheezing Cardiovascular system: S1 & S2 heard Gastrointestinal system: Mild distention, positive bowel sounds. Central nervous system: Patient opening eyes but not following any commands at this time.  Unable to do a complete neurologic exam. Extremities: Edema, ecchymosis on extremities Skin: She has edema of her extremities, ecchymosis Psychiatry: Flat affect, opening eyes but not following commands at this time.    Data Reviewed: I have personally reviewed following labs and imaging studies  CBC: Recent Labs  Lab 01/15/19 0537 01/18/19 0936 01/20/19 0622  WBC 9.6 9.7 7.8  HGB 8.4* 8.2* 7.1*  HCT 26.0* 26.2* 21.9*  MCV 89.3 88.5 88.0  PLT 134* 167 093*   Basic Metabolic Panel: Recent Labs  Lab 01/15/19 0537 01/18/19 0936  01/20/19 0622  NA 133* 134* 132*  K 3.6 3.7 3.8  CL 97* 100 99  CO2 _0 GLUCOSE 138* 123* 115*  BUN 32* 46* 69*  CREATININE 2.06* 2.77* 3.65*  CALCIUM 8.9 8.9 8.7*  PHOS 1.5* 1.9* 2.7   GFR: CrCl cannot be calculated (Unknown ideal weight.). Liver Function Tests: Recent Labs  Lab 01/15/19 0537 01/18/19 0936 01/20/19 0622  ALBUMIN 2.0* 2.2* 2.0*   No results for input(s): LIPASE, AMYLASE in the last 168 hours. No results for input(s): AMMONIA in the last 168 hours. Coagulation Profile: No results for input(s): INR, PROTIME in the last 168 hours. Cardiac Enzymes: No results for input(s): CKTOTAL, CKMB, CKMBINDEX, TROPONINI in the last 168 hours. BNP (last 3 results) No results for input(s): PROBNP in the last 8760 hours. HbA1C: No results for input(s): HGBA1C in the last 72 hours. CBG: No results for input(s): GLUCAP in the last 168 hours. Lipid Profile: No results for input(s): CHOL, HDL, LDLCALC, TRIG, CHOLHDL, LDLDIRECT in the last 72 hours. Thyroid Function Tests: No results for input(s): TSH, T4TOTAL, FREET4, T3FREE, THYROIDAB in the last 72 hours. Anemia Panel: No results for input(s): VITAMINB12, FOLATE, FERRITIN, TIBC, IRON, RETICCTPCT in the last 72 hours. Sepsis Labs: No results for input(s): PROCALCITON, LATICACIDVEN in the last 168 hours.  Recent Results (from the past 240 hour(s))  SARS CORONAVIRUS 2 (TAT 6-24 HRS) Nasopharyngeal Nasopharyngeal Swab     Status: None   Collection Time: 01/11/19  2:45 PM   Specimen: Nasopharyngeal Swab  Result Value Ref Range Status   SARS Coronavirus 2  NEGATIVE NEGATIVE Final    Comment: (NOTE) SARS-CoV-2 target nucleic acids are NOT DETECTED. The SARS-CoV-2 RNA is generally detectable in upper and lower respiratory specimens during the acute phase of infection. Negative results do not preclude SARS-CoV-2 infection, do not rule out co-infections with other pathogens, and should not be used as the sole basis for  treatment or other patient management decisions. Negative results must be combined with clinical observations, patient history, and epidemiological information. The expected result is Negative. Fact Sheet for Patients: SugarRoll.be Fact Sheet for Healthcare Providers: https://www.woods-mathews.com/ This test is not yet approved or cleared by the [place] FDA and  has been authorized for detection and/or diagnosis of SARS-CoV-2 by FDA under an Emergency Use Authorization (EUA). This EUA will remain  in effect (meaning this test can be used) for the duration of the COVID-19 declaration under Section 56 4(b)(1) of the Act, 21 U.S.C. section 360bbb-3(b)(1), unless the authorization is terminated or revoked sooner. Performed at [facility], [place] [address], [place], [place] [address]   Culture, Urine     Status: Abnormal   Collection Time: 01/15/19  5:46 PM   Specimen: Urine, Random  Result Value Ref Range Status   Specimen Description URINE, RANDOM  Final   Special Requests NONE  Final   Culture (A)  Final    <10,000 COLONIES/mL INSIGNIFICANT GROWTH Performed at [facility], [place] [address], [place], [place] [address]    Report Status 01/17/2019 FINAL  Final  Culture, respiratory     Status: None   Collection Time: 01/15/19  6:45 PM   Specimen: Tracheal Aspirate; Respiratory  Result Value Ref Range Status   Specimen Description TRACHEAL ASPIRATE  Final   Special Requests NONE  Final   Gram Stain   Final    ABUNDANT WBC PRESENT, PREDOMINANTLY PMN ABUNDANT GRAM POSITIVE COCCI Performed at [facility], [place] [address] [place], [place] [address]    Culture   Final    MODERATE METHICILLIN RESISTANT STAPHYLOCOCCUS AUREUS   Report Status 01/18/2019 FINAL  Final   Organism ID, Bacteria METHICILLIN RESISTANT STAPHYLOCOCCUS AUREUS  Final      Susceptibility   Methicillin resistant staphylococcus aureus - MIC*     CIPROFLOXACIN >=8 RESISTANT Resistant     ERYTHROMYCIN >=8 RESISTANT Resistant     GENTAMICIN <=0.5 SENSITIVE Sensitive     OXACILLIN >=4 RESISTANT Resistant     TETRACYCLINE <=1 SENSITIVE Sensitive     VANCOMYCIN 1 SENSITIVE Sensitive     TRIMETH/SULFA <=10 SENSITIVE Sensitive     CLINDAMYCIN Value in next row Resistant      RESISTANT>=8    RIFAMPIN Value in next row Sensitive      RESISTANT>=8    Inducible Clindamycin Value in next row Sensitive      RESISTANT>=8    * MODERATE METHICILLIN RESISTANT STAPHYLOCOCCUS AUREUS  Culture, blood (routine x 2)     Status: Abnormal   Collection Time: 01/15/19  6:54 PM   Specimen: BLOOD RIGHT WRIST  Result Value Ref Range Status   Specimen Description BLOOD RIGHT WRIST  Final   Special Requests   Final    BOTTLES DRAWN AEROBIC AND ANAEROBIC Blood Culture adequate volume   Culture  Setup Time   Final    GRAM POSITIVE COCCI IN BOTH AEROBIC AND ANAEROBIC BOTTLES CRITICAL RESULT CALLED TO, READ BACK BY AND VERIFIED WITH: RN SABITA SAPKOTA [id number] AT 1616 BY CM    Culture (A)  Final  METHICILLIN RESISTANT STAPHYLOCOCCUS AUREUS STAPHYLOCOCCUS EPIDERMIDIS SUSCEPTIBILITIES PERFORMED ON PREVIOUS CULTURE WITHIN THE LAST 5 DAYS. Performed at [facility], [place] [address], [place], [place] [address]    Report Status 01/19/2019 FINAL  Final   Organism ID, Bacteria METHICILLIN RESISTANT STAPHYLOCOCCUS AUREUS  Final      Susceptibility   Methicillin resistant staphylococcus aureus - MIC*    CIPROFLOXACIN >=8 RESISTANT Resistant     ERYTHROMYCIN >=8 RESISTANT Resistant     GENTAMICIN <=0.5 SENSITIVE Sensitive     OXACILLIN >=4 RESISTANT Resistant     TETRACYCLINE 2 SENSITIVE Sensitive     VANCOMYCIN 1 SENSITIVE Sensitive     TRIMETH/SULFA <=10 SENSITIVE Sensitive     CLINDAMYCIN >=8 RESISTANT Resistant     RIFAMPIN <=0.5 SENSITIVE Sensitive     Inducible Clindamycin NEGATIVE Sensitive     * METHICILLIN RESISTANT STAPHYLOCOCCUS AUREUS   Culture, blood (routine x 2)     Status: Abnormal   Collection Time: 01/15/19  6:54 PM   Specimen: BLOOD RIGHT HAND  Result Value Ref Range Status   Specimen Description BLOOD RIGHT HAND  Final   Special Requests   Final    BOTTLES DRAWN AEROBIC AND ANAEROBIC Blood Culture results may not be optimal due to an inadequate volume of blood received in culture bottles   Culture  Setup Time   Final    GRAM POSITIVE COCCI IN BOTH AEROBIC AND ANAEROBIC BOTTLES CRITICAL VALUE NOTED.  VALUE IS CONSISTENT WITH PREVIOUSLY REPORTED AND CALLED VALUE. Performed at [facility], [place] [address] [place], [place] [address]    Culture STAPHYLOCOCCUS EPIDERMIDIS (A)  Final   Report Status 01/19/2019 FINAL  Final   Organism ID, Bacteria STAPHYLOCOCCUS EPIDERMIDIS  Final      Susceptibility   Staphylococcus epidermidis - MIC*    CIPROFLOXACIN >=8 RESISTANT Resistant     ERYTHROMYCIN >=8 RESISTANT Resistant     GENTAMICIN 8 INTERMEDIATE Intermediate     OXACILLIN >=4 RESISTANT Resistant     TETRACYCLINE 2 SENSITIVE Sensitive     VANCOMYCIN 1 SENSITIVE Sensitive     TRIMETH/SULFA 160 RESISTANT Resistant     CLINDAMYCIN >=8 RESISTANT Resistant     RIFAMPIN <=0.5 SENSITIVE Sensitive     Inducible Clindamycin NEGATIVE Sensitive     * STAPHYLOCOCCUS EPIDERMIDIS  Blood Culture ID Panel (Reflexed)     Status: Abnormal   Collection Time: 01/15/19  6:54 PM  Result Value Ref Range Status   Enterococcus species NOT DETECTED NOT DETECTED Final   Listeria monocytogenes NOT DETECTED NOT DETECTED Final   Staphylococcus species DETECTED (A) NOT DETECTED Final    Comment: CRITICAL RESULT CALLED TO, READ BACK BY AND VERIFIED WITH: RN S SAPKOTA [id number] AT 1616 BY CM    Staphylococcus aureus (BCID) DETECTED (A) NOT DETECTED Final    Comment: Methicillin (oxacillin)-resistant Staphylococcus aureus (MRSA). MRSA is predictably resistant to beta-lactam antibiotics (except ceftaroline). Preferred therapy is vancomycin  unless clinically contraindicated. Patient requires contact precautions if  hospitalized. CRITICAL RESULT CALLED TO, READ BACK BY AND VERIFIED WITH: RN S SAPKOTA [id number] AT 72 BY CM    Methicillin resistance DETECTED (A) NOT DETECTED Final    Comment: CRITICAL RESULT CALLED TO, READ BACK BY AND VERIFIED WITH: RN S SAPKOTA [id number] AT 91 BY CM    Streptococcus species NOT DETECTED NOT DETECTED Final   Streptococcus agalactiae NOT DETECTED NOT DETECTED Final   Streptococcus pneumoniae NOT DETECTED NOT DETECTED Final   Streptococcus pyogenes NOT DETECTED  NOT DETECTED Final   Acinetobacter baumannii NOT DETECTED NOT DETECTED Final   Enterobacteriaceae species NOT DETECTED NOT DETECTED Final   Enterobacter cloacae complex NOT DETECTED NOT DETECTED Final   Escherichia coli NOT DETECTED NOT DETECTED Final   Klebsiella oxytoca NOT DETECTED NOT DETECTED Final   Klebsiella pneumoniae NOT DETECTED NOT DETECTED Final   Proteus species NOT DETECTED NOT DETECTED Final   Serratia marcescens NOT DETECTED NOT DETECTED Final   Haemophilus influenzae NOT DETECTED NOT DETECTED Final   Neisseria meningitidis NOT DETECTED NOT DETECTED Final   Pseudomonas aeruginosa NOT DETECTED NOT DETECTED Final   Candida albicans NOT DETECTED NOT DETECTED Final   Candida glabrata NOT DETECTED NOT DETECTED Final   Candida krusei NOT DETECTED NOT DETECTED Final   Candida parapsilosis NOT DETECTED NOT DETECTED Final   Candida tropicalis NOT DETECTED NOT DETECTED Final    Comment: Performed at [facility], [place] [address] [place], [place] [address]  Culture, blood (routine x 2)     Status: None (Preliminary result)   Collection Time: 01/18/19  3:45 PM   Specimen: BLOOD RIGHT HAND  Result Value Ref Range Status   Specimen Description BLOOD RIGHT HAND  Final   Special Requests   Final    BOTTLES DRAWN AEROBIC ONLY Blood Culture results may not be optimal due to an inadequate volume of blood received in culture  bottles   Culture   Final    NO GROWTH 2 DAYS Performed at [facility], [place] [address], [place], [place] [address]    Report Status PENDING  Incomplete  Culture, blood (routine x 2)     Status: None (Preliminary result)   Collection Time: 01/18/19  4:00 PM   Specimen: BLOOD LEFT HAND  Result Value Ref Range Status   Specimen Description BLOOD LEFT HAND  Final   Special Requests   Final    BOTTLES DRAWN AEROBIC ONLY Blood Culture adequate volume   Culture   Final    NO GROWTH 2 DAYS Performed at [facility], [place] [address], [place], [place] [address]    Report Status PENDING  Incomplete         Radiology Studies: Ir Fluoro Guide Cv Line Right  Result Date: 01/18/2019 INDICATION: 78 year old female with renal injury, referred for hemodialysis catheter placement EXAM: IMAGE GUIDED HEMODIALYSIS CATHETER PLACEMENT MEDICATIONS: None ANESTHESIA/SEDATION: None FLUOROSCOPY TIME:  Fluoroscopy Time: 0 minutes 12 seconds COMPLICATIONS: None PROCEDURE: Informed written consent was obtained from the patient's family after a discussion of the risks, benefits, and alternatives to treatment. Questions regarding the procedure were encouraged and answered. The right neck was prepped with chlorhexidine in a sterile fashion, and a sterile drape was applied covering the operative field. Maximum barrier sterile technique with sterile gowns and gloves were used for the procedure. A timeout was performed prior to the initiation of the procedure. A micropuncture kit was utilized to access the right internal jugular vein under direct, real-time ultrasound guidance after the overlying soft tissues were anesthetized with 1% lidocaine with epinephrine. Ultrasound image documentation was performed. The microwire was kinked to measure appropriate catheter length. A stiff glidewire was advanced to the level of the IVC. A 15 cm hemodialysis catheter was then placed over the wire. Final catheter positioning  was confirmed and documented with a spot radiographic image. The catheter aspirates and flushes normally. The catheter was flushed with appropriate volume heparin dwells. Dressings were applied. The patient tolerated the procedure well without  immediate post procedural complication. IMPRESSION: Status post image guided placement of right IJ temporary hemodialysis catheter. Signed, Dulcy Fanny. Dellia Nims, RPVI [facility] Radiology Electronically Signed   By: Corrie Mckusick D.O.   On: 01/18/2019 17:04   Ir US Guide Vasc Access Right  Result Date: 01/18/2019 INDICATION: 78 year old female with renal injury, referred for hemodialysis catheter placement EXAM: IMAGE GUIDED HEMODIALYSIS CATHETER PLACEMENT MEDICATIONS: None ANESTHESIA/SEDATION: None FLUOROSCOPY TIME:  Fluoroscopy Time: 0 minutes 12 seconds COMPLICATIONS: None PROCEDURE: Informed written consent was obtained from the patient's family after a discussion of the risks, benefits, and alternatives to treatment. Questions regarding the procedure were encouraged and answered. The right neck was prepped with chlorhexidine in a sterile fashion, and a sterile drape was applied covering the operative field. Maximum barrier sterile technique with sterile gowns and gloves were used for the procedure. A timeout was performed prior to the initiation of the procedure. A micropuncture kit was utilized to access the right internal jugular vein under direct, real-time ultrasound guidance after the overlying soft tissues were anesthetized with 1% lidocaine with epinephrine. Ultrasound image documentation was performed. The microwire was kinked to measure appropriate catheter length. A stiff glidewire was advanced to the level of the IVC. A 15 cm hemodialysis catheter was then placed over the wire. Final catheter positioning was confirmed and documented with a spot radiographic image. The catheter aspirates and flushes normally.  The catheter was flushed with appropriate volume heparin dwells. Dressings were applied. The patient tolerated the procedure well without immediate post procedural complication. IMPRESSION: Status post image guided placement of right IJ temporary hemodialysis catheter. Signed, Dulcy Fanny. Dellia Nims, RPVI [facility] Radiology Electronically Signed   By: Corrie Mckusick D.O.   On: 01/18/2019 17:04    Scheduled Meds: Please see MAR   Yaakov Guthrie, MD  01/20/2019, 2:57 PM

## 2019-01-20 NOTE — Progress Notes (Signed)
Pulmonary [place]   PULMONARY CRITICAL CARE SERVICE  PROGRESS NOTE  Date of Service: 01/20/2019  Stephanie Stark  RJJ:[id number]  DOB: Jan 27, 1941   DOA: 01/01/2019  Referring Physician: Merton Border, MD  HPI: Stephanie Stark is a 78 y.o. female seen for follow up of Acute on Chronic Respiratory Failure.  Patient is on pressure support mode we should be able to continue to advance on pressure support as tolerated  Medications: Reviewed on Rounds  Physical Exam:  Vitals: Temperature 97.2 pulse 89 respiratory rate 30 blood pressure 131/52 saturations 99%  Ventilator Settings mode ventilation pressure support FiO2 28% tidal volume 497 pressure poor 12 PEEP 5  . General: Comfortable at this time . Eyes: Grossly normal lids, irises & conjunctiva . ENT: grossly tongue is normal . Neck: no obvious mass . Cardiovascular: S1 S2 normal no gallop . Respiratory: No rhonchi coarse breath sounds . Abdomen: soft . Skin: no rash seen on limited exam . Musculoskeletal: not rigid . Psychiatric:unable to assess . Neurologic: no seizure no involuntary movements         Lab Data:   Basic Metabolic Panel: Recent Labs  Lab 01/15/19 0537 01/18/19 0936 01/20/19 0622  NA 133* 134* 132*  K 3.6 3.7 3.8  CL 97* 100 99  CO2 '26 26 23  '$ GLUCOSE 138* 123* 115*  BUN 32* 46* 69*  CREATININE 2.06* 2.77* 3.65*  CALCIUM 8.9 8.9 8.7*  PHOS 1.5* 1.9* 2.7    ABG: No results for input(s): PHART, PCO2ART, PO2ART, HCO3, O2SAT in the last 168 hours.  Liver Function Tests: Recent Labs  Lab 01/15/19 0537 01/18/19 0936 01/20/19 0622  ALBUMIN 2.0* 2.2* 2.0*   No results for input(s): LIPASE, AMYLASE in the last 168 hours. No results for input(s): AMMONIA in the last 168 hours.  CBC: Recent Labs  Lab 01/15/19 0537 01/18/19 0936 01/20/19 0622  WBC 9.6 9.7 7.8  HGB 8.4* 8.2* 7.1*  HCT 26.0* 26.2* 21.9*  MCV 89.3 88.5 88.0  PLT 134* 167 136*     Cardiac Enzymes: No results for input(s): CKTOTAL, CKMB, CKMBINDEX, TROPONINI in the last 168 hours.  BNP (last 3 results) No results for input(s): BNP in the last 8760 hours.  ProBNP (last 3 results) No results for input(s): PROBNP in the last 8760 hours.  Radiological Exams: Ir Cyndy Freeze Guide Cv Line Right  Result Date: 01/18/2019 INDICATION: 78 year old female with renal injury, referred for hemodialysis catheter placement EXAM: IMAGE GUIDED HEMODIALYSIS CATHETER PLACEMENT MEDICATIONS: None ANESTHESIA/SEDATION: None FLUOROSCOPY TIME:  Fluoroscopy Time: 0 minutes 12 seconds COMPLICATIONS: None PROCEDURE: Informed written consent was obtained from the patient's family after a discussion of the risks, benefits, and alternatives to treatment. Questions regarding the procedure were encouraged and answered. The right neck was prepped with chlorhexidine in a sterile fashion, and a sterile drape was applied covering the operative field. Maximum barrier sterile technique with sterile gowns and gloves were used for the procedure. A timeout was performed prior to the initiation of the procedure. A micropuncture kit was utilized to access the right internal jugular vein under direct, real-time ultrasound guidance after the overlying soft tissues were anesthetized with 1% lidocaine with epinephrine. Ultrasound image documentation was performed. The microwire was kinked to measure appropriate catheter length. A stiff glidewire was advanced to the level of the IVC. A 15 cm hemodialysis catheter was then placed over the wire. Final catheter positioning was confirmed and documented with a spot radiographic image. The catheter aspirates  and flushes normally. The catheter was flushed with appropriate volume heparin dwells. Dressings were applied. The patient tolerated the procedure well without immediate post procedural complication. IMPRESSION: Status post image guided placement of right IJ temporary hemodialysis  catheter. Signed, Dulcy Fanny. Dellia Nims, RPVI [facility] Inter[facility] Radiology Special[facility] Radiology Electronically Signed   By: Corrie Mckusick D.O.   On: 01/18/2019 17:04   Ir US Guide Vasc Access Right  Result Date: 01/18/2019 INDICATION: 78 year old female with renal injury, referred for hemodialysis catheter placement EXAM: IMAGE GUIDED HEMODIALYSIS CATHETER PLACEMENT MEDICATIONS: None ANESTHESIA/SEDATION: None FLUOROSCOPY TIME:  Fluoroscopy Time: 0 minutes 12 seconds COMPLICATIONS: None PROCEDURE: Informed written consent was obtained from the patient's family after a discussion of the risks, benefits, and alternatives to treatment. Questions regarding the procedure were encouraged and answered. The right neck was prepped with chlorhexidine in a sterile fashion, and a sterile drape was applied covering the operative field. Maximum barrier sterile technique with sterile gowns and gloves were used for the procedure. A timeout was performed prior to the initiation of the procedure. A micropuncture kit was utilized to access the right internal jugular vein under direct, real-time ultrasound guidance after the overlying soft tissues were anesthetized with 1% lidocaine with epinephrine. Ultrasound image documentation was performed. The microwire was kinked to measure appropriate catheter length. A stiff glidewire was advanced to the level of the IVC. A 15 cm hemodialysis catheter was then placed over the wire. Final catheter positioning was confirmed and documented with a spot radiographic image. The catheter aspirates and flushes normally. The catheter was flushed with appropriate volume heparin dwells. Dressings were applied. The patient tolerated the procedure well without immediate post procedural complication. IMPRESSION: Status post image guided placement of right IJ temporary hemodialysis catheter. Signed, Dulcy Fanny. Dellia Nims, RPVI V[facility] Interventional Radiology Specialists [facility]  Radiology Electronically Signed   By: Corrie Mckusick D.O.   On: 01/18/2019 17:04    Assessment/Plan Active Problems:   Acute on chronic respiratory failure with hypoxia (HCC)   Chronic kidney disease, stage IV (severe) (HCC)   Acute lower GI bleeding   Bacterial lobar pneumonia   1. Acute on chronic respiratory failure with hypoxia we will continue with pressure support mode titrate oxygen continue pulmonary toilet. 2. Chronic kidney disease stage IV followed by nephrology 3. Acute GI bleed unchanged hemodynamics have been stable labs have been stable although today the hemoglobin is down to 7.1 4. Bacterial lobar pneumonia treated we will continue with supportive care   I have personally seen and evaluated the patient, evaluated laboratory and imaging results, formulated the assessment and plan and placed orders. The Patient requires high complexity decision making for assessment and support.  Case was discussed on Rounds with the Respiratory Therapy Staff  Allyne Gee, MD [facility] Pulmonary Critical Care Medicine Sleep Medicine

## 2019-01-20 NOTE — Progress Notes (Addendum)
[facility]  ROUNDING NOTE   Subjective:  Patient remains critically ill. Still on the ventilator. Urine output was only 75 cc over the preceding 24 hours.    Objective:  Vital signs in last 24 hours:  Temperature 97.2 pulse 89 respirations 30 blood pressure 131/52   Physical Exam: General: Critically ill-appearing  Head: Endotracheal tube noted to be in place NG in place  Eyes: Anicteric  Neck: Supple, trachea midline  Lungs:  Scattered rhonchi rales, vent assisted  Heart: S1S2 no rubs  Abdomen:  Soft, mild distension  Extremities: 2+ peripheral edema.  Neurologic: Awake, not following commands  Skin: Weeping skin from both upper extremities  Access: Left IJ temporary dialysis catheter removed    Basic Metabolic Panel: Recent Labs  Lab 01/15/19 0537 01/18/19 0936 01/20/19 0622  NA 133* 134* 132*  K 3.6 3.7 3.8  CL 97* 100 99  CO2 _0 GLUCOSE 138* 123* 115*  BUN 32* 46* 69*  CREATININE 2.06* 2.77* 3.65*  CALCIUM 8.9 8.9 8.7*  PHOS 1.5* 1.9* 2.7    Liver Function Tests: Recent Labs  Lab 01/15/19 0537 01/18/19 0936 01/20/19 0622  ALBUMIN 2.0* 2.2* 2.0*   No results for input(s): LIPASE, AMYLASE in the last 168 hours. No results for input(s): AMMONIA in the last 168 hours.  CBC: Recent Labs  Lab 01/15/19 0537 01/18/19 0936 01/20/19 0622  WBC 9.6 9.7 7.8  HGB 8.4* 8.2* 7.1*  HCT 26.0* 26.2* 21.9*  MCV 89.3 88.5 88.0  PLT 134* 167 136*    Cardiac Enzymes: No results for input(s): CKTOTAL, CKMB, CKMBINDEX, TROPONINI in the last 168 hours.  BNP: Invalid input(s): POCBNP  CBG: No results for input(s): GLUCAP in the last 168 hours.  Microbiology: Results for orders placed or performed during the hospital encounter of 01/12/19  SARS CORONAVIRUS 2 (TAT 6-24 HRS) Nasopharyngeal Nasopharyngeal Swab     Status: None   Collection Time: 01/11/19  2:45 PM   Specimen: Nasopharyngeal Swab  Result Value Ref Range Status   SARS  Coronavirus 2 NEGATIVE NEGATIVE Final    Comment: (NOTE) SARS-CoV-2 target nucleic acids are NOT DETECTED. The SARS-CoV-2 RNA is generally detectable in upper and lower respiratory specimens during the acute phase of infection. Negative results do not preclude SARS-CoV-2 infection, do not rule out co-infections with other pathogens, and should not be used as the sole basis for treatment or other patient management decisions. Negative results must be combined with clinical observations, patient history, and epidemiological information. The expected result is Negative. Fact Sheet for Patients: SugarRoll.be Fact Sheet for Healthcare Providers: https://www.woods-mathews.com/ This test is not yet approved or cleared by the [place] FDA and  has been authorized for detection and/or diagnosis of SARS-CoV-2 by FDA under an Emergency Use Authorization (EUA). This EUA will remain  in effect (meaning this test can be used) for the duration of the COVID-19 declaration under Section 56 4(b)(1) of the Act, 21 U.S.C. section 360bbb-3(b)(1), unless the authorization is terminated or revoked sooner. Performed at [facility], [place] [address], [place], [place] [address]     Coagulation Studies: No results for input(s): LABPROT, INR in the last 72 hours.  Urinalysis: No results for input(s): COLORURINE, LABSPEC, PHURINE, GLUCOSEU, HGBUR, BILIRUBINUR, KETONESUR, PROTEINUR, UROBILINOGEN, NITRITE, LEUKOCYTESUR in the last 72 hours.  Invalid input(s): APPERANCEUR    Imaging: Ir Fluoro Guide Cv Line Right  Result Date: 01/18/2019 INDICATION: 78 year old female with renal injury, referred for hemodialysis catheter placement  EXAM: IMAGE GUIDED HEMODIALYSIS CATHETER PLACEMENT MEDICATIONS: None ANESTHESIA/SEDATION: None FLUOROSCOPY TIME:  Fluoroscopy Time: 0 minutes 12 seconds COMPLICATIONS: None PROCEDURE: Informed written consent was obtained from the  patient's family after a discussion of the risks, benefits, and alternatives to treatment. Questions regarding the procedure were encouraged and answered. The right neck was prepped with chlorhexidine in a sterile fashion, and a sterile drape was applied covering the operative field. Maximum barrier sterile technique with sterile gowns and gloves were used for the procedure. A timeout was performed prior to the initiation of the procedure. A micropuncture kit was utilized to access the right internal jugular vein under direct, real-time ultrasound guidance after the overlying soft tissues were anesthetized with 1% lidocaine with epinephrine. Ultrasound image documentation was performed. The microwire was kinked to measure appropriate catheter length. A stiff glidewire was advanced to the level of the IVC. A 15 cm hemodialysis catheter was then placed over the wire. Final catheter positioning was confirmed and documented with a spot radiographic image. The catheter aspirates and flushes normally. The catheter was flushed with appropriate volume heparin dwells. Dressings were applied. The patient tolerated the procedure well without immediate post procedural complication. IMPRESSION: Status post image guided placement of right IJ temporary hemodialysis catheter. Signed, Dulcy Fanny. Dellia Nims, RPVI Vascular and Interventional Radiology Specialists [facility] Radiology Electronically Signed   By: Corrie Mckusick D.O.   On: 01/18/2019 17:04   Ir US Guide Vasc Access Right  Result Date: 01/18/2019 INDICATION: 78 year old female with renal injury, referred for hemodialysis catheter placement EXAM: IMAGE GUIDED HEMODIALYSIS CATHETER PLACEMENT MEDICATIONS: None ANESTHESIA/SEDATION: None FLUOROSCOPY TIME:  Fluoroscopy Time: 0 minutes 12 seconds COMPLICATIONS: None PROCEDURE: Informed written consent was obtained from the patient's family after a discussion of the risks, benefits, and alternatives to treatment. Questions  regarding the procedure were encouraged and answered. The right neck was prepped with chlorhexidine in a sterile fashion, and a sterile drape was applied covering the operative field. Maximum barrier sterile technique with sterile gowns and gloves were used for the procedure. A timeout was performed prior to the initiation of the procedure. A micropuncture kit was utilized to access the right internal jugular vein under direct, real-time ultrasound guidance after the overlying soft tissues were anesthetized with 1% lidocaine with epinephrine. Ultrasound image documentation was performed. The microwire was kinked to measure appropriate catheter length. A stiff glidewire was advanced to the level of the IVC. A 15 cm hemodialysis catheter was then placed over the wire. Final catheter positioning was confirmed and documented with a spot radiographic image. The catheter aspirates and flushes normally. The catheter was flushed with appropriate volume heparin dwells. Dressings were applied. The patient tolerated the procedure well without immediate post procedural complication. IMPRESSION: Status post image guided placement of right IJ temporary hemodialysis catheter. Signed, Dulcy Fanny. Dellia Nims, RPVI Vascular and Interventional Radiology Specialists [facility] Radiology Electronically Signed   By: Corrie Mckusick D.O.   On: 01/18/2019 17:04     Medications:     lidocaine (PF), lidocaine (PF)  Assessment/ Plan:  78 y.o. female with a PMHx of congestive heart failure, rheumatoid arthritis, chronic kidney disease stage IV, hyperlipidemia, fibromyalgia, giant cell arteritis, hypertension, thoracic ascending aortic aneurysm, duodenal ulcers, severe diverticulosis, coronary artery disease, hypothyroidism, who was admitted to [facility] on 01/01/2019 for ongoing treatment of acute respiratory failure, generalized debility, and severe renal dysfunction.   1.  Acute renal failure/chronic kidney disease stage IV.  Temporary  dialysis catheter was replaced late on Monday.  We plan to continue dialysis on MWF schedule as the patient remains oliguric at this point in time.  2.  Acute respiratory failure.  Patient remains on the ventilator at this time.  Weaning protocol as per pulmonary/critical care.  We will aid with ongoing ultrafiltration.  3.  Anemia chronic kidney disease.  Hemoglobin down to 7.1.  Consider transfusion for hemoglobin of 7 or less.  Discussed with primary team..    LOS: 0 Avonell Lenig 9/30/20209:26 AM

## 2019-01-21 DIAGNOSIS — K922 Gastrointestinal hemorrhage, unspecified: Secondary | ICD-10-CM | POA: Diagnosis not present

## 2019-01-21 DIAGNOSIS — J9621 Acute and chronic respiratory failure with hypoxia: Secondary | ICD-10-CM | POA: Diagnosis not present

## 2019-01-21 DIAGNOSIS — J159 Unspecified bacterial pneumonia: Secondary | ICD-10-CM | POA: Diagnosis not present

## 2019-01-21 DIAGNOSIS — N184 Chronic kidney disease, stage 4 (severe): Secondary | ICD-10-CM | POA: Diagnosis not present

## 2019-01-21 LAB — CBC
HCT: 19.8 % — ABNORMAL LOW (ref 36.0–46.0)
HCT: 24.9 % — ABNORMAL LOW (ref 36.0–46.0)
Hemoglobin: 6.4 g/dL — CL (ref 12.0–15.0)
Hemoglobin: 8.2 g/dL — ABNORMAL LOW (ref 12.0–15.0)
MCH: 28.7 pg (ref 26.0–34.0)
MCH: 29.3 pg (ref 26.0–34.0)
MCHC: 32.3 g/dL (ref 30.0–36.0)
MCHC: 32.9 g/dL (ref 30.0–36.0)
MCV: 88.8 fL (ref 80.0–100.0)
MCV: 88.9 fL (ref 80.0–100.0)
Platelets: 111 10*3/uL — ABNORMAL LOW (ref 150–400)
Platelets: 116 10*3/uL — ABNORMAL LOW (ref 150–400)
RBC: 2.23 MIL/uL — ABNORMAL LOW (ref 3.87–5.11)
RBC: 2.8 MIL/uL — ABNORMAL LOW (ref 3.87–5.11)
RDW: 15.3 % (ref 11.5–15.5)
RDW: 14.8 % (ref 11.5–15.5)
WBC: 4.1 10*3/uL (ref 4.0–10.5)
WBC: 5.2 10*3/uL (ref 4.0–10.5)
nRBC: 0 % (ref 0.0–0.2)
nRBC: 0 % (ref 0.0–0.2)

## 2019-01-21 LAB — OCCULT BLOOD X 1 CARD TO LAB, STOOL: Fecal Occult Bld: NEGATIVE

## 2019-01-21 LAB — PREPARE RBC (CROSSMATCH)

## 2019-01-21 NOTE — Progress Notes (Addendum)
Pulmonary Critical Care Medicine [place]   PULMONARY CRITICAL CARE SERVICE  PROGRESS NOTE  Date of Service: 01/21/2019  Kristy-Lee Flohr  [name]  DOB: 08-22-1940   DOA: 01/01/2019  Referring Physician: Merton Border, MD  HPI: Stephanie Stark is a 78 y.o. female seen for follow up of Acute on Chronic Respiratory Failure.  Patient remains on pressure support 12/5 FiO2 28% as tolerated.  Satting well no distress this time.  Medications: Reviewed on Rounds  Physical Exam:  Vitals: Pulse 91 respiration 27 BP 115/44 O2 sat 99%  Ventilator Settings pressure support 12/5 FiO2 20%  . General: Comfortable at this time . Eyes: Grossly normal lids, irises & conjunctiva . ENT: grossly tongue is normal . Neck: no obvious mass . Cardiovascular: S1 S2 normal no gallop . Respiratory: No rales or rhonchi noted . Abdomen: soft . Skin: no rash seen on limited exam . Musculoskeletal: not rigid . Psychiatric:unable to assess . Neurologic: no seizure no involuntary movements         Lab Data:   Basic Metabolic Panel: Recent Labs  Lab 01/15/19 0537 01/18/19 0936 01/20/19 0622  NA 133* 134* 132*  K 3.6 3.7 3.8  CL 97* 100 99  CO2 26 26 23   GLUCOSE 138* 123* 115*  BUN 32* 46* 69*  CREATININE 2.06* 2.77* 3.65*  CALCIUM 8.9 8.9 8.7*  PHOS 1.5* 1.9* 2.7    ABG: No results for input(s): PHART, PCO2ART, PO2ART, HCO3, O2SAT in the last 168 hours.  Liver Function Tests: Recent Labs  Lab 01/15/19 0537 01/18/19 0936 01/20/19 0622  ALBUMIN 2.0* 2.2* 2.0*   No results for input(s): LIPASE, AMYLASE in the last 168 hours. No results for input(s): AMMONIA in the last 168 hours.  CBC: Recent Labs  Lab 01/15/19 0537 01/18/19 0936 01/20/19 0622 01/21/19 0654  WBC 9.6 9.7 7.8 4.1  HGB 8.4* 8.2* 7.1* 6.4*  HCT 26.0* 26.2* 21.9* 19.8*  MCV 89.3 88.5 88.0 88.8  PLT 134* 167 136* 111*    Cardiac Enzymes: No results for input(s): CKTOTAL, CKMB, CKMBINDEX,  TROPONINI in the last 168 hours.  BNP (last 3 results) No results for input(s): BNP in the last 8760 hours.  ProBNP (last 3 results) No results for input(s): PROBNP in the last 8760 hours.  Radiological Exams: No results found.  Assessment/Plan Active Problems:   Acute on chronic respiratory failure with hypoxia (HCC)   Chronic kidney disease, stage IV (severe) (HCC)   Acute lower GI bleeding   Bacterial lobar pneumonia   1. Acute on chronic respiratory failure with hypoxia we will continue with pressure support mode titrate oxygen continue pulmonary toilet. 2. Chronic kidney disease stage IV followed by nephrology 3. Acute GI bleed unchanged hemodynamics have been stable labs have been stable although today the hemoglobin is down to 7.1 4. Bacterial lobar pneumonia treated we will continue with supportive care   I have personally seen and evaluated the patient, evaluated laboratory and imaging results, formulated the assessment and plan and placed orders. The Patient requires high complexity decision making for assessment and support.  Case was discussed on Rounds with the Respiratory Therapy Staff  Allyne Gee, MD [facility] Pulmonary Critical Care Medicine Sleep Medicine

## 2019-01-22 ENCOUNTER — Ambulatory Visit (HOSPITAL_COMMUNITY)
Admission: EM | Admit: 2019-01-22 | Discharge: 2019-01-22 | Disposition: A | Discharge status: Other Acute Inpatient Hospital | Payer: Medicare Other | Attending: Emergency Medicine | Admitting: Emergency Medicine

## 2019-01-22 ENCOUNTER — Other Ambulatory Visit: Payer: Self-pay

## 2019-01-22 ENCOUNTER — Emergency Department (HOSPITAL_COMMUNITY): Payer: Medicare Other | Admitting: Anesthesiology

## 2019-01-22 ENCOUNTER — Encounter (HOSPITAL_COMMUNITY)
Admission: EM | Disposition: A | Discharge status: Nursing Home (Includes ICF) | Payer: Self-pay | Source: Home / Self Care | Attending: Emergency Medicine

## 2019-01-22 ENCOUNTER — Inpatient Hospital Stay (HOSPITAL_COMMUNITY)
Admission: RE | Admit: 2019-01-22 | Discharge: 2019-02-17 | Disposition: A | Discharge status: Hospice | Payer: Medicare Other | Source: Other Acute Inpatient Hospital | Attending: Internal Medicine | Admitting: Internal Medicine

## 2019-01-22 ENCOUNTER — Encounter (HOSPITAL_COMMUNITY): Payer: Self-pay | Admitting: Emergency Medicine

## 2019-01-22 DIAGNOSIS — D649 Anemia, unspecified: Secondary | ICD-10-CM | POA: Diagnosis not present

## 2019-01-22 DIAGNOSIS — Y833 Surgical operation with formation of external stoma as the cause of abnormal reaction of the patient, or of later complication, without mention of misadventure at the time of the procedure: Secondary | ICD-10-CM | POA: Diagnosis not present

## 2019-01-22 DIAGNOSIS — J9501 Hemorrhage from tracheostomy stoma: Secondary | ICD-10-CM | POA: Diagnosis present

## 2019-01-22 DIAGNOSIS — J159 Unspecified bacterial pneumonia: Secondary | ICD-10-CM | POA: Diagnosis not present

## 2019-01-22 DIAGNOSIS — N184 Chronic kidney disease, stage 4 (severe): Secondary | ICD-10-CM | POA: Diagnosis not present

## 2019-01-22 DIAGNOSIS — K922 Gastrointestinal hemorrhage, unspecified: Secondary | ICD-10-CM | POA: Diagnosis present

## 2019-01-22 DIAGNOSIS — I509 Heart failure, unspecified: Secondary | ICD-10-CM

## 2019-01-22 DIAGNOSIS — J9621 Acute and chronic respiratory failure with hypoxia: Secondary | ICD-10-CM | POA: Insufficient documentation

## 2019-01-22 DIAGNOSIS — I120 Hypertensive chronic kidney disease with stage 5 chronic kidney disease or end stage renal disease: Secondary | ICD-10-CM | POA: Diagnosis not present

## 2019-01-22 DIAGNOSIS — Z992 Dependence on renal dialysis: Secondary | ICD-10-CM

## 2019-01-22 DIAGNOSIS — J189 Pneumonia, unspecified organism: Secondary | ICD-10-CM

## 2019-01-22 DIAGNOSIS — N186 End stage renal disease: Secondary | ICD-10-CM | POA: Diagnosis not present

## 2019-01-22 DIAGNOSIS — Z931 Gastrostomy status: Secondary | ICD-10-CM

## 2019-01-22 HISTORY — PX: TRACHEOSTOMY REVISION: SHX6133

## 2019-01-22 LAB — BASIC METABOLIC PANEL
Anion gap: 8 (ref 5–15)
BUN: 27 mg/dL — ABNORMAL HIGH (ref 8–23)
CO2: 27 mmol/L (ref 22–32)
Calcium: 8.6 mg/dL — ABNORMAL LOW (ref 8.9–10.3)
Chloride: 100 mmol/L (ref 98–111)
Creatinine, Ser: 1.91 mg/dL — ABNORMAL HIGH (ref 0.44–1.00)
GFR calc Af Amer: 28 mL/min — ABNORMAL LOW (ref >60)
GFR calc non Af Amer: 24 mL/min — ABNORMAL LOW (ref >60)
Glucose, Bld: 109 mg/dL — ABNORMAL HIGH (ref 70–99)
Potassium: 3.9 mmol/L (ref 3.5–5.1)
Sodium: 135 mmol/L (ref 135–145)

## 2019-01-22 LAB — CBC
HCT: 24.8 % — ABNORMAL LOW (ref 36.0–46.0)
HCT: 23.3 % — ABNORMAL LOW (ref 36.0–46.0)
Hemoglobin: 7.8 g/dL — ABNORMAL LOW (ref 12.0–15.0)
Hemoglobin: 7.6 g/dL — ABNORMAL LOW (ref 12.0–15.0)
MCH: 28.5 pg (ref 26.0–34.0)
MCH: 29.2 pg (ref 26.0–34.0)
MCHC: 31.5 g/dL (ref 30.0–36.0)
MCHC: 32.6 g/dL (ref 30.0–36.0)
MCV: 90.5 fL (ref 80.0–100.0)
MCV: 89.6 fL (ref 80.0–100.0)
Platelets: 112 10*3/uL — ABNORMAL LOW (ref 150–400)
Platelets: 122 10*3/uL — ABNORMAL LOW (ref 150–400)
RBC: 2.74 MIL/uL — ABNORMAL LOW (ref 3.87–5.11)
RBC: 2.6 MIL/uL — ABNORMAL LOW (ref 3.87–5.11)
RDW: 15.3 % (ref 11.5–15.5)
RDW: 15.4 % (ref 11.5–15.5)
WBC: 3.8 10*3/uL — ABNORMAL LOW (ref 4.0–10.5)
WBC: 4.3 10*3/uL (ref 4.0–10.5)
nRBC: 0 % (ref 0.0–0.2)
nRBC: 0 % (ref 0.0–0.2)

## 2019-01-22 LAB — RENAL FUNCTION PANEL
Albumin: 2.1 g/dL — ABNORMAL LOW (ref 3.5–5.0)
Anion gap: 10 (ref 5–15)
BUN: 41 mg/dL — ABNORMAL HIGH (ref 8–23)
CO2: 24 mmol/L (ref 22–32)
Calcium: 9.1 mg/dL (ref 8.9–10.3)
Chloride: 101 mmol/L (ref 98–111)
Creatinine, Ser: 2.56 mg/dL — ABNORMAL HIGH (ref 0.44–1.00)
GFR calc Af Amer: 20 mL/min — ABNORMAL LOW (ref >60)
GFR calc non Af Amer: 17 mL/min — ABNORMAL LOW (ref >60)
Glucose, Bld: 106 mg/dL — ABNORMAL HIGH (ref 70–99)
Phosphorus: 2.3 mg/dL — ABNORMAL LOW (ref 2.5–4.6)
Potassium: 3.9 mmol/L (ref 3.5–5.1)
Sodium: 135 mmol/L (ref 135–145)

## 2019-01-22 LAB — VANCOMYCIN, TROUGH: Vancomycin Tr: 15 ug/mL (ref 15–20)

## 2019-01-22 SURGERY — REVISION, STOMA, TRACHEA
Anesthesia: General | Site: Neck

## 2019-01-22 MED ORDER — PROPOFOL 10 MG/ML IV BOLUS
INTRAVENOUS | Status: DC | PRN
  Administered 2019-01-22: 40 mg via INTRAVENOUS

## 2019-01-22 MED ORDER — PHENYLEPHRINE HCL (PRESSORS) 10 MG/ML IV SOLN
INTRAVENOUS | Status: DC | PRN
  Administered 2019-01-22: 160 ug via INTRAVENOUS
  Administered 2019-01-22 (×3): 80 ug via INTRAVENOUS

## 2019-01-22 MED ORDER — SODIUM CHLORIDE 0.9 % IV BOLUS
500.0000 mL | Freq: Once | INTRAVENOUS | Status: AC
  Administered 2019-01-22: 500 mL via INTRAVENOUS

## 2019-01-22 MED ORDER — EPHEDRINE SULFATE 50 MG/ML IJ SOLN
INTRAMUSCULAR | Status: DC | PRN
  Administered 2019-01-22 (×4): 10 mg via INTRAVENOUS

## 2019-01-22 MED ORDER — FENTANYL CITRATE (PF) 250 MCG/5ML IJ SOLN
INTRAMUSCULAR | Status: AC
  Filled 2019-01-22: qty 5

## 2019-01-22 MED ORDER — LACTATED RINGERS IV SOLN
INTRAVENOUS | Status: DC | PRN
  Administered 2019-01-22: 21:00:00 via INTRAVENOUS

## 2019-01-22 MED ORDER — LIDOCAINE-EPINEPHRINE 1 %-1:100000 IJ SOLN
INTRAMUSCULAR | Status: AC
  Filled 2019-01-22: qty 1

## 2019-01-22 MED ORDER — 0.9 % SODIUM CHLORIDE (POUR BTL) OPTIME
TOPICAL | Status: DC | PRN
  Administered 2019-01-22: 1000 mL

## 2019-01-22 MED ORDER — PROPOFOL 10 MG/ML IV BOLUS
INTRAVENOUS | Status: AC
  Filled 2019-01-22: qty 20

## 2019-01-22 MED ORDER — LIDOCAINE-EPINEPHRINE (PF) 2 %-1:200000 IJ SOLN
20.0000 mL | Freq: Once | INTRAMUSCULAR | Status: AC
  Administered 2019-01-22: 20 mL

## 2019-01-22 MED ORDER — TRANEXAMIC ACID 1000 MG/10ML IV SOLN
2000.0000 mg | Freq: Once | INTRAVENOUS | Status: AC
  Administered 2019-01-22: 2000 mg via TOPICAL

## 2019-01-22 MED ORDER — LIDOCAINE 2% (20 MG/ML) 5 ML SYRINGE
INTRAMUSCULAR | Status: AC
  Filled 2019-01-22: qty 5

## 2019-01-22 MED ORDER — ROCURONIUM BROMIDE 10 MG/ML (PF) SYRINGE
PREFILLED_SYRINGE | INTRAVENOUS | Status: AC
  Filled 2019-01-22: qty 10

## 2019-01-22 MED ORDER — SUCCINYLCHOLINE CHLORIDE 200 MG/10ML IV SOSY
PREFILLED_SYRINGE | INTRAVENOUS | Status: AC
  Filled 2019-01-22: qty 10

## 2019-01-22 SURGICAL SUPPLY — 41 items
BLADE CLIPPER SURG (BLADE) IMPLANT
BLADE SURG 15 STRL LF DISP TIS (BLADE) IMPLANT
BLADE SURG 15 STRL SS (BLADE)
CANISTER SUCT 3000ML PPV (MISCELLANEOUS) ×3 IMPLANT
CLEANER TIP ELECTROSURG 2X2 (MISCELLANEOUS) ×3 IMPLANT
COAGULATOR SUCT 8FR VV (MISCELLANEOUS) ×2 IMPLANT
COVER SURGICAL LIGHT HANDLE (MISCELLANEOUS) ×3 IMPLANT
COVER WAND RF STERILE (DRAPES) ×3 IMPLANT
DECANTER SPIKE VIAL GLASS SM (MISCELLANEOUS) ×1 IMPLANT
DRAPE HALF SHEET 40X57 (DRAPES) IMPLANT
ELECT COATED BLADE 2.86 ST (ELECTRODE) ×3 IMPLANT
ELECT REM PT RETURN 9FT ADLT (ELECTROSURGICAL) ×3
ELECTRODE REM PT RTRN 9FT ADLT (ELECTROSURGICAL) ×1 IMPLANT
GAUZE 4X4 16PLY RFD (DISPOSABLE) IMPLANT
GAUZE XEROFORM 5X9 LF (GAUZE/BANDAGES/DRESSINGS) IMPLANT
GLOVE ECLIPSE 7.5 STRL STRAW (GLOVE) ×3 IMPLANT
GOWN STRL REUS W/ TWL LRG LVL3 (GOWN DISPOSABLE) ×3 IMPLANT
GOWN STRL REUS W/TWL LRG LVL3 (GOWN DISPOSABLE) ×6
HEMOSTAT SNOW SURGICEL 2X4 (HEMOSTASIS) ×2 IMPLANT
HOLDER TRACH TUBE VELCRO 19.5 (MISCELLANEOUS) IMPLANT
KIT BASIN OR (CUSTOM PROCEDURE TRAY) ×3 IMPLANT
KIT SUCTION CATH 14FR (SUCTIONS) IMPLANT
KIT TURNOVER KIT B (KITS) ×3 IMPLANT
NDL HYPO 25GX1X1/2 BEV (NEEDLE) ×1 IMPLANT
NEEDLE HYPO 25GX1X1/2 BEV (NEEDLE) IMPLANT
NS IRRIG 1000ML POUR BTL (IV SOLUTION) ×3 IMPLANT
PAD ARMBOARD 7.5X6 YLW CONV (MISCELLANEOUS) ×6 IMPLANT
PENCIL BUTTON HOLSTER BLD 10FT (ELECTRODE) IMPLANT
SPONGE INTESTINAL PEANUT (DISPOSABLE) ×3 IMPLANT
SUT CHROMIC 3 0 SH 27 (SUTURE) IMPLANT
SUT PROLENE 2 0 SH DA (SUTURE) ×3 IMPLANT
SUT SILK 3 0 (SUTURE) ×2
SUT SILK 3-0 18XBRD TIE 12 (SUTURE) ×1 IMPLANT
SYR 20ML LL LF (SYRINGE) IMPLANT
SYR BULB 3OZ (MISCELLANEOUS) IMPLANT
SYR CONTROL 10ML LL (SYRINGE) IMPLANT
TOWEL GREEN STERILE FF (TOWEL DISPOSABLE) ×3 IMPLANT
TRAY ENT MC OR (CUSTOM PROCEDURE TRAY) ×3 IMPLANT
TUBE CONNECTING 12'X1/4 (SUCTIONS) ×1
TUBE CONNECTING 12X1/4 (SUCTIONS) ×2 IMPLANT
WATER STERILE IRR 1000ML POUR (IV SOLUTION) ×3 IMPLANT

## 2019-01-22 NOTE — ED Notes (Signed)
Dialysis catheter noted to right chest with port for access.  Per nurse on [facility] okay to use for blood draw and medications.  Draws and flushes well.

## 2019-01-22 NOTE — ED Triage Notes (Signed)
Brought down from [facility] for bleeding around trach.  Per nurse patient had trach placed on 9/22.  Sutures were removed yesterday with no bleeding noted.  Started bleeding around 4pm today. Patient received 1 unit of blood yesterday and went to dialysis today with no issues reported.

## 2019-01-22 NOTE — Anesthesia Preprocedure Evaluation (Addendum)
Anesthesia Evaluation  Patient identified by MRN, date of birth, ID band Patient awake    Reviewed: Allergy & Precautions, NPO status , Patient's Chart, lab work & pertinent test results, Unable to perform ROS - Chart review onlyPreop documentation limited or incomplete due to emergent nature of procedure.  History of Anesthesia Complications Negative for: history of anesthetic complications  Airway Mallampati: Trach       Dental   Pulmonary pneumonia,   Hypoxic respiratory failure s/p tracheostomy, now with bleeding at site     + decreased breath sounds      Cardiovascular hypertension, Normal cardiovascular exam+ Valvular Problems/Murmurs AI    '20 TTE - Mild LVH. Moderate AI, mild MR and TR, trivial PR. Dilated ascending aorta (4.4cm)    Neuro/Psych    GI/Hepatic   Endo/Other    Renal/GU ESRF and DialysisRenal disease     Musculoskeletal  (+) Arthritis , Rheumatoid disorders,  Fibromyalgia -  Abdominal   Peds  Hematology  (+) anemia ,  Thrombocytopenia    Anesthesia Other Findings   Reproductive/Obstetrics                           Anesthesia Physical Anesthesia Plan  ASA: IV and emergent  Anesthesia Plan: General   Post-op Pain Management:    Induction: Inhalational  PONV Risk Score and Plan: 3 and Treatment may vary due to age or medical condition and Ondansetron  Airway Management Planned: Tracheostomy  Additional Equipment: None  Intra-op Plan:   Post-operative Plan: Post-operative intubation/ventilation  Informed Consent:     History available from chart only and Only emergency history available  Plan Discussed with: CRNA, Anesthesiologist and Surgeon  Anesthesia Plan Comments:        Anesthesia Quick Evaluation

## 2019-01-22 NOTE — Consult Note (Signed)
Chief Complaint: Patient was seen in consultation today for gastrostomy placement.  Referring Physician(s): Janora Norlander, NP  Supervising Physician: Corrie Mckusick  Patient Status: [facility] inpatient  History of Present Illness: Stephanie Stark is a 78 y.o. female with a past medical history significant for RA, fibromyalgia, giant cell arteritis, HLD, HTN, CHF, thoracic AAA, severe diverticulosis with recent GI bleed requiring multiple blood transfusions, duodenal ulcers, CKD IV with recent AKI currently receiving HD and acute respiratory failure requiring intubation and subsequent tracheostomy placement on 01/12/19 who was transferred to [facility] on 01/01/19 for further care. She has been unable to tolerate PO intake and has had an NG in place since at least 01/04/19 per chart. IR has been consulted for possible gastrostomy placement.  Patient seen at bedside receiving dialysis, she is alert and appears to mouth words slightly however they are unintelligible. Spoke with patient's son Randall Hiss today who states he would like "whatever needs to be done to get her better" and agrees to gastrostomy placement.   Past Medical History:  Diagnosis Date   Acute lower GI bleeding    Acute on chronic respiratory failure with hypoxia (HCC)    Bacterial lobar pneumonia    Chronic kidney disease, stage IV (severe) ([place])     Past Surgical History:  Procedure Laterality Date   IR FLUORO GUIDE CV LINE LEFT  01/04/2019   IR FLUORO GUIDE CV LINE RIGHT  01/18/2019   IR US GUIDE VASC ACCESS LEFT  01/04/2019   IR US GUIDE VASC ACCESS RIGHT  01/18/2019   TRACHEOSTOMY TUBE PLACEMENT N/A 01/12/2019   Procedure: TRACHEOSTOMY;  Surgeon: Rozetta Nunnery, MD;  Location: [place];  Service: ENT;  Laterality: N/A;    Allergies: Patient has no allergy information on record.  Medications: Prior to Admission medications   Not on File     History reviewed. No pertinent family history.  Social History    Socioeconomic History   Marital status: Divorced    Spouse name: Not on file   Number of children: Not on file   Years of education: Not on file   Highest education level: Not on file  Occupational History   Not on file  Social Needs   Financial resource strain: Not on file   Food insecurity    Worry: Not on file    Inability: Not on file   Transportation needs    Medical: Not on file    Non-medical: Not on file  Tobacco Use   Smoking status: Not on file  Substance and Sexual Activity   Alcohol use: Not on file   Drug use: Not on file   Sexual activity: Not on file  Lifestyle   Physical activity    Days per week: Not on file    Minutes per session: Not on file   Stress: Not on file  Relationships   Social connections    Talks on phone: Not on file    Gets together: Not on file    Attends religious service: Not on file    Active member of club or organization: Not on file    Attends meetings of clubs or organizations: Not on file    Relationship status: Not on file  Other Topics Concern   Not on file  Social History Narrative   Not on file     Review of Systems: A 12 point ROS discussed and pertinent positives are indicated in the HPI above.  All other systems are negative.  Review of Systems  Unable to perform ROS: Patient nonverbal    Vital Signs: There were no vitals taken for this visit.  Physical Exam Vitals signs reviewed.  Constitutional:      Comments: Nonverbal, tracks examiner, does not nod/shake head when asked questions which RN reports is baseline.  HENT:     Head: Normocephalic.  Cardiovascular:     Rate and Rhythm: Normal rate and regular rhythm.  Pulmonary:     Effort: Pulmonary effort is normal.     Breath sounds: Normal breath sounds.  Abdominal:     General: There is distension (mild; has been present since admission per staff).     Palpations: Abdomen is soft.     Comments: (+) NG in place, tube feeds infusing.   Skin:    General: Skin is warm and dry.     Comments: (+) BUE edema (+) multiple small areas of scabbing on bilateral upper extremities  Neurological:     Mental Status: She is alert.      MD Evaluation Airway: (Tracheostomy) Heart: WNL Abdomen: WNL Chest/ Lungs: WNL ASA  Classification: 3 Mallampati/Airway Score: (Tracheostomy)   Imaging: Ct Abdomen Pelvis Wo Contrast  Result Date: 01/16/2019 CLINICAL DATA:  Evaluate for appendicitis.  Diffuse abdominal pain EXAM: CT ABDOMEN AND PELVIS WITHOUT CONTRAST TECHNIQUE: Multidetector CT imaging of the abdomen and pelvis was performed following the standard protocol without IV contrast. COMPARISON:  None FINDINGS: Lower chest: Large right pleural effusion and moderate left effusion identified. Patchy airspace densities are identified within the imaged portions of both lungs. Hepatobiliary: No focal liver abnormality is seen. Status post cholecystectomy. No biliary dilatation. Pancreas: Unremarkable. No pancreatic ductal dilatation or surrounding inflammatory changes. Spleen: Normal in size without focal abnormality. Adrenals/Urinary Tract: Adrenal glands are unremarkable. Kidneys are normal, without renal calculi, focal lesion, or hydronephrosis. Bladder is unremarkable. Foley catheter in situ. Stomach/Bowel: Marked diffuse gastric wall edema is identified. Feeding tube tip is in the gastric antrum. Several endo clips are identified within the descending duodenum. There is of foci of high attenuation material along the course of the descending duodenum which may reflect enteric contrast material within a duodenal diverticulum. No abnormal distension of the small or large bowel loops. The appendix is visualized and appears normal. Scattered colonic diverticula noted. No colonic wall thickening or inflammation. Vascular/Lymphatic: Aortic atherosclerosis. No aneurysm. No abdominopelvic adenopathy. Reproductive: Status post hysterectomy. No adnexal  masses. Other: Marked diffuse body wall edema. Trace ascites identified within the abdomen and pelvis. No focal fluid collections. No free intraperitoneal air. Musculoskeletal: Previous open fixation of right hip fracture. 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 IMPRESSION: 1. No evidence for acute appendicitis. 2. Diffuse edema throughout the wall of the stomach. Feeding tube is identified within the gastric antrum. 3. Marked diffuse body wall edema, bilateral pleural effusions and trace ascites. 4.  Aortic Atherosclerosis (ICD10-I70.0). Electronically Signed   By: Kerby Moors M.D.   On: 01/16/2019 18:02   Dg Abd 1 View  Result Date: 01/10/2019 CLINICAL DATA:  NG tube placement EXAM: ABDOMEN - 1 VIEW COMPARISON:  01/02/2019 FINDINGS: Orogastric tube with the tip projecting over the stomach. There is no bowel dilatation to suggest obstruction. There is no evidence of pneumoperitoneum, portal venous gas or pneumatosis. There are no pathologic calcifications along the expected course of the ureters. There is bilateral interstitial thickening at the lung bases with bilateral small pleural effusions. The osseous structures are unremarkable. IMPRESSION:  Orogastric tube with the tip projecting over the stomach. Electronically Signed   By: Kathreen Devoid   On: 01/10/2019 12:21   Dg Abd 1 View  Result Date: 01/02/2019 CLINICAL DATA:  Evaluate NG tube placement EXAM: ABDOMEN - 1 VIEW COMPARISON:  Earlier today. FINDINGS: 1207 hours. NG tube tip is in the mid stomach with proximal port of the NG tube at or just distal to the EG junction. Nonspecific bowel gas pattern without substantial small bowel or colonic gaseous dilatation. The radiopaque foreign bodies over the right abdomen are similar to prior. Degenerative changes noted lumbar spine. IMPRESSION: NG tube tip is in the mid stomach. Electronically Signed   By: Misty Stanley M.D.   On: 01/02/2019 12:37   Dg  Abd 1 View  Result Date: 01/01/2019 CLINICAL DATA:  NG tube placement. EXAM: ABDOMEN - 1 VIEW COMPARISON:  None. FINDINGS: NG tube tip is in the left upper quadrant of the abdomen, probably in the midbody of the stomach. No dilated bowel. No acute abnormality of the visualized portion of the abdomen. IMPRESSION: NG tube tip in the midbody of the stomach. Electronically Signed   By: Lorriane Shire M.D.   On: 01/01/2019 20:48   US Renal  Result Date: 01/02/2019 CLINICAL DATA:  Acute renal failure. EXAM: RENAL / URINARY TRACT ULTRASOUND COMPLETE COMPARISON:  None. FINDINGS: Right Kidney: Renal measurements: 8.6 x 4.5 x 4.7 cm = volume: 93.3 mL . Echogenicity within normal limits. No mass or hydronephrosis visualized. Left Kidney: Renal measurements: 10.6 x 4.1 x 4.6 cm = volume: 104.4 mL. Echogenicity within normal limits. No mass or hydronephrosis visualized. Bladder: Decompressed with Foley catheter. Left pleural effusion. IMPRESSION: No hydronephrosis. Electronically Signed   By: Lovey Newcomer M.D.   On: 01/02/2019 14:17   Ir Fluoro Guide Cv Line Left  Result Date: 01/04/2019 CLINICAL DATA:  Chronic kidney disease stage 4, needs access for hemodialysis EXAM: EXAM RIGHT IJ CATHETER PLACEMENT UNDER ULTRASOUND AND FLUOROSCOPIC GUIDANCE TECHNIQUE: Patency of the left IJ vein was confirmed with ultrasound with image documentation. An appropriate skin site was determined. Skin site was marked. Region was prepped using maximum barrier technique including cap and mask, sterile gown, sterile gloves, large sterile sheet, and Chlorhexidine as cutaneous antisepsis. The region was infiltrated locally with 1% lidocaine. Under real-time ultrasound guidance, the left IJ vein was accessed with micropuncture set; the needle tip within the vein was confirmed with ultrasound image documentation. The micropuncture dilator over a guidewire for vascular dilator which allowed advancement of a 20 cm Trialysis catheter. This was  positioned with the tip at the cavoatrial junction. Spot chest radiograph shows good positioning and no pneumothorax. Catheter was flushed and sutured externally with 0-Prolene sutures. Patient tolerated the procedure well. FLUOROSCOPY TIME:  0.2 minute; 11 uGym2 DAP COMPLICATIONS: COMPLICATIONS none IMPRESSION: 1. Technically successful left IJ Trialysis catheter placement. Electronically Signed   By: Lucrezia Europe M.D.   On: 01/04/2019 16:39   Ir Fluoro Guide Cv Line Right  Result Date: 01/18/2019 INDICATION: 78 year old female with renal injury, referred for hemodialysis catheter placement EXAM: IMAGE GUIDED HEMODIALYSIS CATHETER PLACEMENT MEDICATIONS: None ANESTHESIA/SEDATION: None FLUOROSCOPY TIME:  Fluoroscopy Time: 0 minutes 12 seconds COMPLICATIONS: None PROCEDURE: Informed written consent was obtained from the patient's family after a discussion of the risks, benefits, and alternatives to treatment. Questions regarding the procedure were encouraged and answered. The right neck was prepped with chlorhexidine in a sterile fashion, and a sterile drape was applied covering the operative field.  Maximum barrier sterile technique with sterile gowns and gloves were used for the procedure. A timeout was performed prior to the initiation of the procedure. A micropuncture kit was utilized to access the right internal jugular vein under direct, real-time ultrasound guidance after the overlying soft tissues were anesthetized with 1% lidocaine with epinephrine. Ultrasound image documentation was performed. The microwire was kinked to measure appropriate catheter length. A stiff glidewire was advanced to the level of the IVC. A 15 cm hemodialysis catheter was then placed over the wire. Final catheter positioning was confirmed and documented with a spot radiographic image. The catheter aspirates and flushes normally. The catheter was flushed with appropriate volume heparin dwells. Dressings were applied. The patient  tolerated the procedure well without immediate post procedural complication. IMPRESSION: Status post image guided placement of right IJ temporary hemodialysis catheter. Signed, Dulcy Fanny. Dellia Nims, RPVI [facility] Radiology Electronically Signed   By: Corrie Mckusick D.O.   On: 01/18/2019 17:04   Ir US Guide Vasc Access Left  Result Date: 01/04/2019 CLINICAL DATA:  Chronic kidney disease stage 4, needs access for hemodialysis EXAM: EXAM RIGHT IJ CATHETER PLACEMENT UNDER ULTRASOUND AND FLUOROSCOPIC GUIDANCE TECHNIQUE: Patency of the left IJ vein was confirmed with ultrasound with image documentation. An appropriate skin site was determined. Skin site was marked. Region was prepped using maximum barrier technique including cap and mask, sterile gown, sterile gloves, large sterile sheet, and Chlorhexidine as cutaneous antisepsis. The region was infiltrated locally with 1% lidocaine. Under real-time ultrasound guidance, the left IJ vein was accessed with micropuncture set; the needle tip within the vein was confirmed with ultrasound image documentation. The micropuncture dilator over a guidewire for vascular dilator which allowed advancement of a 20 cm Trialysis catheter. This was positioned with the tip at the cavoatrial junction. Spot chest radiograph shows good positioning and no pneumothorax. Catheter was flushed and sutured externally with 0-Prolene sutures. Patient tolerated the procedure well. FLUOROSCOPY TIME:  0.2 minute; 11 uGym2 DAP COMPLICATIONS: COMPLICATIONS none IMPRESSION: 1. Technically successful left IJ Trialysis catheter placement. Electronically Signed   By: Lucrezia Europe M.D.   On: 01/04/2019 16:39   Ir US Guide Vasc Access Right  Result Date: 01/18/2019 INDICATION: 78 year old female with renal injury, referred for hemodialysis catheter placement EXAM: IMAGE GUIDED HEMODIALYSIS CATHETER PLACEMENT MEDICATIONS: None ANESTHESIA/SEDATION: None  FLUOROSCOPY TIME:  Fluoroscopy Time: 0 minutes 12 seconds COMPLICATIONS: None PROCEDURE: Informed written consent was obtained from the patient's family after a discussion of the risks, benefits, and alternatives to treatment. Questions regarding the procedure were encouraged and answered. The right neck was prepped with chlorhexidine in a sterile fashion, and a sterile drape was applied covering the operative field. Maximum barrier sterile technique with sterile gowns and gloves were used for the procedure. A timeout was performed prior to the initiation of the procedure. A micropuncture kit was utilized to access the right internal jugular vein under direct, real-time ultrasound guidance after the overlying soft tissues were anesthetized with 1% lidocaine with epinephrine. Ultrasound image documentation was performed. The microwire was kinked to measure appropriate catheter length. A stiff glidewire was advanced to the level of the IVC. A 15 cm hemodialysis catheter was then placed over the wire. Final catheter positioning was confirmed and documented with a spot radiographic image. The catheter aspirates and flushes normally. The catheter was flushed with appropriate volume heparin dwells. Dressings were applied. The patient tolerated the procedure well without immediate post procedural complication. IMPRESSION: Status post  image guided placement of right IJ temporary hemodialysis catheter. Signed, Dulcy Fanny. Dellia Nims, RPVI V[facility] and Interventional Radiology Specialists [facility] Radiology Electronically Signed   By: Corrie Mckusick D.O.   On: 01/18/2019 17:04   Dg Chest Port 1 View  Result Date: 01/15/2019 CLINICAL DATA:  Fever. EXAM: PORTABLE CHEST 1 VIEW COMPARISON:  Radiograph of January 02, 2019. FINDINGS: Stable cardiomegaly. Endotracheal tube has been removed and tracheostomy tube is noted in grossly good position. Feeding tube is seen entering stomach. Left internal jugular dialysis catheter is  noted with distal tip in expected position of cavoatrial junction. No pneumothorax is noted. Diffuse bilateral lung opacities are noted concerning for edema or pneumonia. Small right pleural effusion is noted. Bony thorax is unremarkable. IMPRESSION: Tracheostomy and feeding tubes in good position. Left internal jugular catheter is noted in grossly good position. Bilateral lung opacities are noted concerning for edema or possibly pneumonia. Small right pleural effusion. Electronically Signed   By: Marijo Conception M.D.   On: 01/15/2019 18:41   Dg Chest Port 1 View  Result Date: 01/02/2019 CLINICAL DATA:  78 year old female with history of respiratory failure. EXAM: PORTABLE CHEST 1 VIEW COMPARISON:  No priors. FINDINGS: An endotracheal tube is in place with tip 3.2 cm above the carina. A nasogastric tube is seen extending into the stomach, however, the tip of the nasogastric tube extends below the lower margin of the image. Patchy multifocal interstitial and airspace disease throughout the lungs bilaterally, with relative sparing in the right lung base. Moderate left and small right pleural effusions. Heart size is borderline enlarged. The patient is rotated to the right on today's exam, resulting in distortion of the mediastinal contours and reduced diagnostic sensitivity and specificity for mediastinal pathology. IMPRESSION: 1. Support apparatus, as above. 2. The appearance the chest suggests severe multilobar pneumonia. 3. Moderate left and small right pleural effusions. Electronically Signed   By: Vinnie Langton M.D.   On: 01/02/2019 08:38   Dg Abd Portable 1v  Result Date: 01/12/2019 CLINICAL DATA:  NG tube placement Hx acute lower GI bleeding, chronic kidney disease EXAM: PORTABLE ABDOMEN - 1 VIEW COMPARISON:  01/10/2019 FINDINGS: Weighted-tip feeding tube extends to the region of the gastric antrum. Cholecystectomy clips. Endoscopic clip in the right mid abdomen. Normal bowel gas pattern. Orthopedic  hardware in the right femur partially visualized. IMPRESSION: 1. Feeding tube placement to the gastric antrum. 2. Normal bowel gas pattern. Electronically Signed   By: Lucrezia Europe M.D.   On: 01/12/2019 17:01   Dg Abd Portable 1v  Result Date: 01/02/2019 CLINICAL DATA:  NG tube placement. EXAM: PORTABLE ABDOMEN - 1 VIEW COMPARISON:  01/01/2019 FINDINGS: Patient rotated to the right. Nasogastric tube is present with tip over the stomach just left of midline as the side port is in the region of the gastroesophageal junction. Bowel gas pattern is nonobstructive. Remainder of the exam is unchanged. IMPRESSION: Nonobstructive bowel gas pattern. Nasogastric tube with tip over the stomach in the left upper quadrant and side-port at the level of the gastroesophageal junction. Electronically Signed   By: Marin Olp M.D.   On: 01/02/2019 08:38    Labs:  CBC: Recent Labs    01/20/19 0622 01/21/19 0654 01/21/19 1818 01/22/19 0524  WBC 7.8 4.1 5.2 3.8*  HGB 7.1* 6.4* 8.2* 7.8*  HCT 21.9* 19.8* 24.9* 24.8*  PLT 136* 111* 116* 112*    COAGS: Recent Labs    01/02/19 0557  INR 1.2    BMP:  Recent Labs    01/15/19 0537 01/18/19 0936 01/20/19 0622 01/22/19 0524  NA 133* 134* 132* 135  K 3.6 3.7 3.8 3.9  CL 97* 100 99 101  CO2 _0 GLUCOSE 138* 123* 115* 106*  BUN 32* 46* 69* 41*  CALCIUM 8.9 8.9 8.7* 9.1  CREATININE 2.06* 2.77* 3.65* 2.56*  GFRNONAA 22* 16* 11* 17*  GFRAA 26* 18* 13* 20*    LIVER FUNCTION TESTS: Recent Labs    01/02/19 0557  01/15/19 0537 01/18/19 0936 01/20/19 0622 01/22/19 0524  BILITOT 1.1  --   --   --   --   --   AST 23  --   --   --   --   --   ALT 19  --   --   --   --   --   ALKPHOS 147*  --   --   --   --   --   PROT 5.6*  --   --   --   --   --   ALBUMIN 2.3*   < > 2.0* 2.2* 2.0* 2.1*   < > = values in this interval not displayed.    TUMOR MARKERS: No results for input(s): AFPTM, CEA, CA199, CHROMGRNA in the last 8760  hours.  Assessment and Plan:  78 y/o F with multiple medical problems who ultimately experienced respiratory distress requiring intubation and subsequent tracheostomy placement on 01/12/19 and inability to tolerate PO intake, currently with NG in place since at least 01/04/19. IR has been consulted for possible gastrostomy placement which has been approved.  Per [facility] request patient has been planned for gastrostomy placement on Tuesday 10/6 due to dialysis scheduled (MWF). Per chart patient on IV heparin only - per RN this is to be stopped today and she is not aware that any other blood thinner will be started, unable to speak with provider today regarding IV heparin plans. I have placed orders in [facility] paper chart for patient to be NPO at midnight on 10/6, no Lovenox/heparin SQ after 10/4 - will need to further clarify patient's IV heparin status on Monday.   Risks and benefits discussed with the patient's son Randall Hiss including, but not limited to the need for a barium enema during the procedure, bleeding, infection, peritonitis, or damage to adjacent structures.  All of the patient's son's questions were answered, patient is agreeable to proceed.  Consent signed and in IR control room.  Thank you for this interesting consult.  I greatly enjoyed meeting Canisha Issac and look forward to participating in their care.  A copy of this report was sent to the requesting provider on this date.  Electronically Signed: Joaquim Nam, PA-C 01/22/2019, 1:30 PM   I spent a total of 40 Minutes in face to face in clinical consultation, greater than 50% of which was counseling/coordinating care for gastrostomy placement.

## 2019-01-22 NOTE — Progress Notes (Signed)
[facility]  ROUNDING NOTE   Subjective:  Patient seen and evaluated at bedside. She continues to have critical illness. Remains on the ventilator. Continues on dialysis on MWF schedule.   Objective:  Vital signs in last 24 hours:  Temperature 97.4 pulse 79 respirations 23 blood pressure 118/48   Physical Exam: General: Critically ill-appearing  Head: Endotracheal tube noted to be in place NG in place  Eyes: Anicteric  Neck: Supple, trachea midline  Lungs:  Scattered rhonchi rales, vent assisted  Heart: S1S2 no rubs  Abdomen:  Soft, mild distension  Extremities: 2+ peripheral edema.  Neurologic: Awake, not following commands  Skin: Weeping skin from both upper extremities  Access: Temporary right internal jugular dialysis catheter    Basic Metabolic Panel: Recent Labs  Lab 01/18/19 0936 01/20/19 0622 01/22/19 0524  NA 134* 132* 135  K 3.7 3.8 3.9  CL 100 99 101  CO2 26 23 24   GLUCOSE 123* 115* 106*  BUN 46* 69* 41*  CREATININE 2.77* 3.65* 2.56*  CALCIUM 8.9 8.7* 9.1  PHOS 1.9* 2.7 2.3*    Liver Function Tests: Recent Labs  Lab 01/18/19 0936 01/20/19 0622 01/22/19 0524  ALBUMIN 2.2* 2.0* 2.1*   No results for input(s): LIPASE, AMYLASE in the last 168 hours. No results for input(s): AMMONIA in the last 168 hours.  CBC: Recent Labs  Lab 01/18/19 0936 01/20/19 0622 01/21/19 0654 01/21/19 1818 01/22/19 0524  WBC 9.7 7.8 4.1 5.2 3.8*  HGB 8.2* 7.1* 6.4* 8.2* 7.8*  HCT 26.2* 21.9* 19.8* 24.9* 24.8*  MCV 88.5 88.0 88.8 88.9 90.5  PLT 167 136* 111* 116* 112*    Cardiac Enzymes: No results for input(s): CKTOTAL, CKMB, CKMBINDEX, TROPONINI in the last 168 hours.  BNP: Invalid input(s): POCBNP  CBG: No results for input(s): GLUCAP in the last 168 hours.  Microbiology: Results for orders placed or performed during the hospital encounter of 01/12/19  SARS CORONAVIRUS 2 (TAT 6-24 HRS) Nasopharyngeal Nasopharyngeal Swab     Status: None    Collection Time: 01/11/19  2:45 PM   Specimen: Nasopharyngeal Swab  Result Value Ref Range Status   SARS Coronavirus 2 NEGATIVE NEGATIVE Final    Comment: (NOTE) SARS-CoV-2 target nucleic acids are NOT DETECTED. The SARS-CoV-2 RNA is generally detectable in upper and lower respiratory specimens during the acute phase of infection. Negative results do not preclude SARS-CoV-2 infection, do not rule out co-infections with other pathogens, and should not be used as the sole basis for treatment or other patient management decisions. Negative results must be combined with clinical observations, patient history, and epidemiological information. The expected result is Negative. Fact Sheet for Patients: SugarRoll.be Fact Sheet for Healthcare Providers: https://www.woods-mathews.com/ This test is not yet approved or cleared by the [place] FDA and  has been authorized for detection and/or diagnosis of SARS-CoV-2 by FDA under an Emergency Use Authorization (EUA). This EUA will remain  in effect (meaning this test can be used) for the duration of the COVID-19 declaration under Section 56 4(b)(1) of the Act, 21 U.S.C. section 360bbb-3(b)(1), unless the authorization is terminated or revoked sooner. Performed at [facility], [place] [address], [place], [place] [address]     Coagulation Studies: No results for input(s): LABPROT, INR in the last 72 hours.  Urinalysis: No results for input(s): COLORURINE, LABSPEC, PHURINE, GLUCOSEU, HGBUR, BILIRUBINUR, KETONESUR, PROTEINUR, UROBILINOGEN, NITRITE, LEUKOCYTESUR in the last 72 hours.  Invalid input(s): APPERANCEUR    Imaging: No results found.  Medications:     lidocaine (PF), lidocaine (PF)  Assessment/ Plan:  78 y.o. female with a PMHx of congestive heart failure, rheumatoid arthritis, chronic kidney disease stage IV, hyperlipidemia, fibromyalgia, giant cell arteritis,  hypertension, thoracic ascending aortic aneurysm, duodenal ulcers, severe diverticulosis, coronary artery disease, hypothyroidism, who was admitted to [facility] on 01/01/2019 for ongoing treatment of acute respiratory failure, generalized debility, and severe renal dysfunction.   1.  Acute renal failure/chronic kidney disease stage IV.  Patient due for hemodialysis today.  Orders have been prepared.  Thereafter next dialysis treatment on Monday.  2.  Acute respiratory failure.  Respiratory failure persistent.  Remains on the ventilator.  Continue to perform ultrafiltration..  3.  Anemia chronic kidney disease.  Hemoglobin up to 7.8 post blood transfusion.  Continue to monitor CBC.    LOS: 0 Lindalee Huizinga 10/2/20208:25 AM

## 2019-01-22 NOTE — Op Note (Signed)
DATE OF PROCEDURE:  01/22/2019                              OPERATIVE REPORT  SURGEON:  Leta Baptist, MD  PREOPERATIVE DIAGNOSES: 1. Acute on chronic respiratory failure. 2. Bleeding from tracheostomy stoma.  POSTOPERATIVE DIAGNOSES: 1. Acute on chronic respiratory failure. 2. Bleeding from trachea and right thyroid lobe.  PROCEDURE PERFORMED:  Neck exploration with control of postop tracheostomy bleeding (CPT 35800)  ANESTHESIA:  General endotracheal tube anesthesia.  COMPLICATIONS:  None.  ESTIMATED BLOOD LOSS:  Minimal.  INDICATION FOR PROCEDURE:  Stephanie Stark is a 78 y.o. female who had a tracheostomy placed on September 22 by Dr. Radene Journey.  Reportedly the sutures were removed yesterday.  Earlier this evening, the nursing staff at [facility] noticed profuse bleeding from the tracheostomy wound.  The physician on scene had tried lidocaine with epinephrine around the site without improvement. ENT was consulted for emergent control of her bleeding.  DESCRIPTION:  The patient was taken emergently to the operating room and placed supine on the operating table. General anesthesia was administered via the pre-existing trach tube. The trach tube was subsequently removed. A large amount of blood clots were suctioned from the trachea.  A #6 ET tube was inserted via the trach stoma into the trachea, and was vented without difficulty.   The patient's neck wound was explored. Multiple bleeding spots were noted from the tracheal edge and the right thyroid lobe. The bleeding areas were cauterized with a suction cautery device. Good hemostasis was achieved.  The ET tube was replaced with a new #6 cuffed Shiley trach tube.  Good end tidal volume and CO2 return was noted. The care of the patient was transferred to the anesthesiologist. She was transferred back to the [facility] in stable condition.  OPERATIVE FINDINGS:  Multiple bleeding spots were noted from the tracheal edge and the right  thyroid lobe.  SPECIMEN:  None.  FOLLOWUP CARE:  The patient will return to the [facility].  Jacquelin Hawking [facility] 01/22/2019 9:44 PM

## 2019-01-22 NOTE — ED Provider Notes (Addendum)
[facility] EMERGENCY DEPARTMENT Provider Note   CSN: [id number] Arrival date & time: 01/22/19  1846     History   Chief Complaint Chief Complaint  Patient presents with  . bleeding from trach    HPI Stephanie Stark is a 78 y.o. female.     Patient is a 78 year old female who presents with bleeding from her trach.  [facility].  She had a tracheostomy placed on September 22 by Dr. Lucia Gaskins.  Reportedly the sutures removed yesterday.  About an hour prior to ED arrival, they noticed some bleeding from the wound which is continued.  The physician on scene had tried lidocaine with epinephrine around the site without improvement in symptoms and she was brought down here.  History is limited due to patient's nonverbal state due to the trach.  She is not on anticoagulants other than heparin flushes through her port.  She has had to recently get some blood due to anemia with hgb in the 6s.  It had improved to 8, but dropped back down today into the 7s.     Past Medical History:  Diagnosis Date  . Acute lower GI bleeding   . Acute on chronic respiratory failure with hypoxia ([place])   . Bacterial lobar pneumonia   . Chronic kidney disease, stage IV (severe) [facility])     Patient Active Problem List   Diagnosis Date Noted  . Acute on chronic respiratory failure with hypoxia ([place])   . Chronic kidney disease, stage IV (severe) ([place])   . Acute lower GI bleeding   . Bacterial lobar pneumonia     Past Surgical History:  Procedure Laterality Date  . IR FLUORO GUIDE CV LINE LEFT  01/04/2019  . IR FLUORO GUIDE CV LINE RIGHT  01/18/2019  . IR US GUIDE VASC ACCESS LEFT  01/04/2019  . IR US GUIDE VASC ACCESS RIGHT  01/18/2019  . TRACHEOSTOMY TUBE PLACEMENT N/A 01/12/2019   Procedure: TRACHEOSTOMY;  Surgeon: Rozetta Nunnery, MD;  Location: [place];  Service: ENT;  Laterality: N/A;     OB History   No obstetric history on file.       Home Medications    Prior to Admission medications   Not on File    Family History No family history on file.  Social History Social History   Tobacco Use  . Smoking status: Unknown If Ever Smoked  Substance Use Topics  . Alcohol use: Not Currently  . Drug use: Not Currently     Allergies   Patient has no allergy information on record.   Review of Systems Review of Systems  Unable to perform ROS: Intubated     Physical Exam Updated Vital Signs BP (!) 105/38   Pulse 87   Resp (!) 7   Ht 5\' 4"  (1.626 m)   Wt 68.4 kg   SpO2 100%   BMI 25.90 kg/m   Physical Exam Constitutional:      Appearance: She is well-developed. She is ill-appearing.  HENT:     Head: Normocephalic and atraumatic.  Eyes:     Pupils: Pupils are equal, round, and reactive to light.  Neck:     Musculoskeletal: Normal range of motion and neck supple.     Comments: Tracheostomy tube in place with constant brisk  oozing of blood.  No source identified. Cardiovascular:     Rate and Rhythm: Normal rate and regular rhythm.     Heart  sounds: Normal heart sounds.  Pulmonary:     Effort: Pulmonary effort is normal. No respiratory distress.     Breath sounds: Normal breath sounds. No wheezing or rales.  Chest:     Chest wall: No tenderness.  Abdominal:     General: Bowel sounds are normal.     Palpations: Abdomen is soft.     Tenderness: There is no abdominal tenderness. There is no guarding or rebound.  Musculoskeletal: Normal range of motion.  Lymphadenopathy:     Cervical: No cervical adenopathy.  Skin:    General: Skin is warm and dry.     Findings: No rash.  Neurological:     Mental Status: She is alert and oriented to person, place, and time.      ED Treatments / Results  Labs (all labs ordered are listed, but only abnormal results are displayed) Labs Reviewed  CBC - Abnormal; Notable for the following components:      Result Value   RBC 2.60 (*)    Hemoglobin 7.6 (*)     HCT 23.3 (*)    Platelets 122 (*)    All other components within normal limits  BASIC METABOLIC PANEL - Abnormal; Notable for the following components:   Glucose, Bld 109 (*)    BUN 27 (*)    Creatinine, Ser 1.91 (*)    Calcium 8.6 (*)    GFR calc non Af Amer 24 (*)    GFR calc Af Amer 28 (*)    All other components within normal limits    EKG None  Radiology No results found.  Procedures Procedures (including critical care time)  Medications Ordered in ED Medications  lidocaine-EPINEPHrine (XYLOCAINE W/EPI) 2 %-1:200000 (PF) injection 20 mL (20 mLs Infiltration Given 01/22/19 1911)  tranexamic acid (CYKLOKAPRON) injection 2,000 mg (2,000 mg Topical Given 01/22/19 1913)  sodium chloride 0.9 % bolus 500 mL (0 mLs Intravenous Stopped 01/22/19 2058)  propofol (DIPRIVAN) 10 mg/mL bolus/IV push (has no administration in time range)  fentaNYL (SUBLIMAZE) 250 MCG/5ML injection (has no administration in time range)  succinylcholine (ANECTINE) 200 MG/10ML syringe (has no administration in time range)  lidocaine 20 MG/ML injection (has no administration in time range)  rocuronium bromide 100 MG/10ML SOSY (has no administration in time range)  lidocaine-EPINEPHrine (XYLOCAINE W/EPI) 1 %-1:100000 (with pres) injection (has no administration in time range)     Initial Impression / Assessment and Plan / ED Course  I have reviewed the triage vital signs and the nursing notes.  Pertinent labs & imaging results that were available during my care of the patient were reviewed by me and considered in my medical decision making (see chart for details).        Patient is a 78 year old female who was brought down for bleeding from her tracheostomy site.  She had brisk bleeding.  I attempted to stop the bleeding with injection of lidocaine with epinephrine around the site.  I also tried placing some TXA locally to the area.  The bleeding continued.  I consulted ENT.  I initially spoke with Dr.  Erik Obey who advised that since the surgery was done by Dr. Lucia Gaskins I needed to talk to him.  I ultimately was able to contact Dr. Lucia Gaskins who advised that he would get a hold of Dr. Benjamine Mola to come in and evaluate the patient.  Dr. Leonarda Salon did take the patient to the operating room for definitive repair.  Patient was hypotensive at 1 point in the ED with  pressures down into the 80s and 90s.  She was given a bolus of normal saline at 500 cc and her blood pressure stabilized.  Her hemoglobin is stable from what it was earlier in the day.  CRITICAL CARE Performed by: Malvin Johns Total critical care time: 75 minutes Critical care time was exclusive of separately billable procedures and treating other patients. Critical care was necessary to treat or prevent imminent or life-threatening deterioration. Critical care was time spent personally by me on the following activities: development of treatment plan with patient and/or surrogate as well as nursing, discussions with consultants, evaluation of patient's response to treatment, examination of patient, obtaining history from patient or surrogate, ordering and performing treatments and interventions, ordering and review of laboratory studies, ordering and review of radiographic studies, pulse oximetry and re-evaluation of patient's condition.   Final Clinical Impressions(s) / ED Diagnoses   Final diagnoses:  Tracheostomy hemorrhage [facility])    ED Discharge Orders    None       Malvin Johns, MD 01/22/19 OR:[id number]    Malvin Johns, MD 01/22/19 2332

## 2019-01-22 NOTE — ED Notes (Signed)
Provider using topical TXA currently trying to stop the bleeding.

## 2019-01-22 NOTE — Progress Notes (Addendum)
Pulmonary Critical Care Medicine [place]   PULMONARY CRITICAL CARE SERVICE  PROGRESS NOTE  Date of Service: 01/22/2019  Stephanie Stark  [id number]  DOB: March 25, 1941   DOA: 01/01/2019  Referring Physician: Merton Border, MD  HPI: Stephanie Stark is a 78 y.o. female seen for follow up of Acute on Chronic Respiratory Failure.  Patient continues on pressure support 12/5 FiO2 28% this time.  Medications: Reviewed on Rounds  Physical Exam:  Vitals: Pulse 79 respiratory 3 BP 118/48 O2 sat 99% temp 97.4  Ventilator Settings respiratory 5 FiO2 28%  . General: Comfortable at this time . Eyes: Grossly normal lids, irises & conjunctiva . ENT: grossly tongue is normal . Neck: no obvious mass . Cardiovascular: S1 S2 normal no gallop . Respiratory: No rales or rhonchi noted . Abdomen: soft . Skin: no rash seen on limited exam . Musculoskeletal: not rigid . Psychiatric:unable to assess . Neurologic: no seizure no involuntary movements         Lab Data:   Basic Metabolic Panel: Recent Labs  Lab 01/18/19 0936 01/20/19 0622 01/22/19 0524  NA 134* 132* 135  K 3.7 3.8 3.9  CL 100 99 101  CO2 26 23 24   GLUCOSE 123* 115* 106*  BUN 46* 69* 41*  CREATININE 2.77* 3.65* 2.56*  CALCIUM 8.9 8.7* 9.1  PHOS 1.9* 2.7 2.3*    ABG: No results for input(s): PHART, PCO2ART, PO2ART, HCO3, O2SAT in the last 168 hours.  Liver Function Tests: Recent Labs  Lab 01/18/19 0936 01/20/19 0622 01/22/19 0524  ALBUMIN 2.2* 2.0* 2.1*   No results for input(s): LIPASE, AMYLASE in the last 168 hours. No results for input(s): AMMONIA in the last 168 hours.  CBC: Recent Labs  Lab 01/18/19 0936 01/20/19 0622 01/21/19 0654 01/21/19 1818 01/22/19 0524  WBC 9.7 7.8 4.1 5.2 3.8*  HGB 8.2* 7.1* 6.4* 8.2* 7.8*  HCT 26.2* 21.9* 19.8* 24.9* 24.8*  MCV 88.5 88.0 88.8 88.9 90.5  PLT 167 136* 111* 116* 112*    Cardiac Enzymes: No results for input(s): CKTOTAL, CKMB,  CKMBINDEX, TROPONINI in the last 168 hours.  BNP (last 3 results) No results for input(s): BNP in the last 8760 hours.  ProBNP (last 3 results) No results for input(s): PROBNP in the last 8760 hours.  Radiological Exams: No results found.  Assessment/Plan Active Problems:   Acute on chronic respiratory failure with hypoxia (HCC)   Chronic kidney disease, stage IV (severe) (HCC)   Acute lower GI bleeding   Bacterial lobar pneumonia   1. Acute on chronic respiratory failure with hypoxia we will continue with pressure support mode titrate oxygen continue pulmonary toilet. 2. Chronic kidney disease stage IV followed by nephrology 3. Acute GI bleed unchanged hemodynamics have been stable labs have been stable although today the hemoglobin is down to 7.1 4. Bacterial lobar pneumonia treated we will continue with supportive care   I have personally seen and evaluated the patient, evaluated laboratory and imaging results, formulated the assessment and plan and placed orders. The Patient requires high complexity decision making for assessment and support.  Case was discussed on Rounds with the Respiratory Therapy Staff  Allyne Gee, MD [facility] Pulmonary Critical Care Medicine Sleep Medicine

## 2019-01-22 NOTE — ED Notes (Signed)
Pt taken to OR with ENT

## 2019-01-22 NOTE — Transfer of Care (Signed)
Immediate Anesthesia Transfer of Care Note  Patient: Zenaya Growney  Procedure(s) Performed: TRACHEOSTOMY REVISION, CONTROL OF BLEEDING (N/A Neck)  [facility] Location: [facility]  Anesthesia Type:General  Level of Consciousness: drowsy  Airway & Oxygen Therapy: Patient placed on Ventilator (see vital sign flow sheet for setting)  Post-op Assessment: Report given to RN and Post -op Vital signs reviewed and stable  Post vital signs: Reviewed and stable  Last Vitals:  Vitals Value Taken Time  BP    Temp    Pulse    Resp    SpO2      Last Pain: There were no vitals filed for this visit.       Complications: No apparent anesthesia complications

## 2019-01-22 NOTE — Progress Notes (Signed)
Pt discharged back to [facility] from [facility]. Pt was not cared for in PACU or by PACU RN

## 2019-01-23 DIAGNOSIS — J9621 Acute and chronic respiratory failure with hypoxia: Secondary | ICD-10-CM

## 2019-01-23 DIAGNOSIS — J159 Unspecified bacterial pneumonia: Secondary | ICD-10-CM | POA: Diagnosis not present

## 2019-01-23 DIAGNOSIS — N184 Chronic kidney disease, stage 4 (severe): Secondary | ICD-10-CM

## 2019-01-23 DIAGNOSIS — K922 Gastrointestinal hemorrhage, unspecified: Secondary | ICD-10-CM

## 2019-01-23 LAB — CBC
HCT: 20.1 % — ABNORMAL LOW (ref 36.0–46.0)
HCT: 24.4 % — ABNORMAL LOW (ref 36.0–46.0)
Hemoglobin: 6.5 g/dL — CL (ref 12.0–15.0)
Hemoglobin: 8.3 g/dL — ABNORMAL LOW (ref 12.0–15.0)
MCH: 29.1 pg (ref 26.0–34.0)
MCH: 30.6 pg (ref 26.0–34.0)
MCHC: 32.3 g/dL (ref 30.0–36.0)
MCHC: 34 g/dL (ref 30.0–36.0)
MCV: 90.1 fL (ref 80.0–100.0)
MCV: 90 fL (ref 80.0–100.0)
Platelets: 116 10*3/uL — ABNORMAL LOW (ref 150–400)
Platelets: 118 10*3/uL — ABNORMAL LOW (ref 150–400)
RBC: 2.23 MIL/uL — ABNORMAL LOW (ref 3.87–5.11)
RBC: 2.71 MIL/uL — ABNORMAL LOW (ref 3.87–5.11)
RDW: 15.4 % (ref 11.5–15.5)
RDW: 14.6 % (ref 11.5–15.5)
WBC: 5.1 10*3/uL (ref 4.0–10.5)
WBC: 5.9 10*3/uL (ref 4.0–10.5)
nRBC: 0 % (ref 0.0–0.2)
nRBC: 0 % (ref 0.0–0.2)

## 2019-01-23 LAB — CULTURE, BLOOD (ROUTINE X 2)
Culture: NO GROWTH
Culture: NO GROWTH
Special Requests: ADEQUATE

## 2019-01-23 LAB — PREPARE RBC (CROSSMATCH)

## 2019-01-23 NOTE — Anesthesia Postprocedure Evaluation (Signed)
Anesthesia Post Note  Patient: Kamile Ciccarello  Procedure(s) Performed: TRACHEOSTOMY REVISION, CONTROL OF BLEEDING (N/A Neck)     Patient location during evaluation: Other Anesthesia Type: General Level of consciousness: awake Pain management: pain level controlled Vital Signs Assessment: post-procedure vital signs reviewed and stable Respiratory status: patient on ventilator - see flowsheet for VS Cardiovascular status: blood pressure returned to baseline and stable Postop Assessment: no apparent nausea or vomiting Anesthetic complications: no    Last Vitals:  Vitals:   01/22/19 2045 01/22/19 2100  BP: (!) 113/46 (!) 105/38  Pulse: 86 87  Resp: 20 (!) 7  SpO2: 100% 100%    Last Pain: There were no vitals filed for this visit.               Audry Pili

## 2019-01-23 NOTE — Progress Notes (Addendum)
Pulmonary Critical Care Medicine [place]   PULMONARY CRITICAL CARE SERVICE  PROGRESS NOTE  Date of Service: 01/23/2019  Brittyn Klecha  [name]  DOB: 04-10-1941   DOA: 01/22/2019  Referring Physician: Merton Border, MD  HPI: Stephanie Stark is a 78 y.o. female seen for follow up of Acute on Chronic Respiratory Failure.  Patient remains on pressure support 12/5 and FiO2 of 30% at this time satting well no distress.  Medications: Reviewed on Rounds  Physical Exam:  Vitals: Pulse 73 respirations 18 BP 138/70 O2 sat 90% temp 96.2  Ventilator Settings pressure support 12/5 FiO2 30%  . General: Comfortable at this time . Eyes: Grossly normal lids, irises & conjunctiva . ENT: grossly tongue is normal . Neck: no obvious mass . Cardiovascular: S1 S2 normal no gallop . Respiratory: No rales rhonchi noted . Abdomen: soft . Skin: no rash seen on limited exam . Musculoskeletal: not rigid . Psychiatric:unable to assess . Neurologic: no seizure no involuntary movements         Lab Data:   Basic Metabolic Panel: Recent Labs  Lab 01/18/19 0936 01/20/19 0622 01/22/19 0524 01/22/19 1920  NA 134* 132* 135 135  K 3.7 3.8 3.9 3.9  CL 100 99 101 100  CO2 26 23 24 27   GLUCOSE 123* 115* 106* 109*  BUN 46* 69* 41* 27*  CREATININE 2.77* 3.65* 2.56* 1.91*  CALCIUM 8.9 8.7* 9.1 8.6*  PHOS 1.9* 2.7 2.3*  --     ABG: No results for input(s): PHART, PCO2ART, PO2ART, HCO3, O2SAT in the last 168 hours.  Liver Function Tests: Recent Labs  Lab 01/18/19 0936 01/20/19 0622 01/22/19 0524  ALBUMIN 2.2* 2.0* 2.1*   No results for input(s): LIPASE, AMYLASE in the last 168 hours. No results for input(s): AMMONIA in the last 168 hours.  CBC: Recent Labs  Lab 01/21/19 1818 01/22/19 0524 01/22/19 1920 01/23/19 0936 01/23/19 1518  WBC 5.2 3.8* 4.3 5.1 5.9  HGB 8.2* 7.8* 7.6* 6.5* 8.3*  HCT 24.9* 24.8* 23.3* 20.1* 24.4*  MCV 88.9 90.5 89.6 90.1 90.0  PLT  116* 112* 122* 116* 118*    Cardiac Enzymes: No results for input(s): CKTOTAL, CKMB, CKMBINDEX, TROPONINI in the last 168 hours.  BNP (last 3 results) No results for input(s): BNP in the last 8760 hours.  ProBNP (last 3 results) No results for input(s): PROBNP in the last 8760 hours.  Radiological Exams: No results found.  Assessment/Plan Active Problems:   Acute on chronic respiratory failure with hypoxia (HCC)   Chronic kidney disease, stage IV (severe) (HCC)   Acute lower GI bleeding   Bacterial lobar pneumonia   1. Acute on chronic respiratory failure with hypoxia we will continue with pressure support mode titrate oxygen continue pulmonary toilet. 2. Chronic kidney disease stage IV followed by nephrology 3. Acute GI bleed unchanged hemodynamics have been stable labs have been stable although today the hemoglobin is down to 7.1 4. Bacterial lobar pneumonia treated we will continue with supportive care   I have personally seen and evaluated the patient, evaluated laboratory and imaging results, formulated the assessment and plan and placed orders. The Patient requires high complexity decision making for assessment and support.  Case was discussed on Rounds with the Respiratory Therapy Staff  Allyne Gee, MD [facility] Pulmonary Critical Care Medicine Sleep Medicine

## 2019-01-24 DIAGNOSIS — K922 Gastrointestinal hemorrhage, unspecified: Secondary | ICD-10-CM | POA: Diagnosis not present

## 2019-01-24 DIAGNOSIS — J9621 Acute and chronic respiratory failure with hypoxia: Secondary | ICD-10-CM | POA: Diagnosis not present

## 2019-01-24 DIAGNOSIS — N184 Chronic kidney disease, stage 4 (severe): Secondary | ICD-10-CM | POA: Diagnosis not present

## 2019-01-24 DIAGNOSIS — J159 Unspecified bacterial pneumonia: Secondary | ICD-10-CM | POA: Diagnosis not present

## 2019-01-24 LAB — TYPE AND SCREEN
ABO/RH(D): A POS
Antibody Screen: POSITIVE
Donor AG Type: NEGATIVE
Donor AG Type: NEGATIVE
Unit division: 0
Unit division: 0

## 2019-01-24 LAB — BPAM RBC
Blood Product Expiration Date: 202010142359
Blood Product Expiration Date: 202010182359
ISSUE DATE / TIME: 202010011513
ISSUE DATE / TIME: 202010031106
Unit Type and Rh: 600
Unit Type and Rh: 6200

## 2019-01-24 LAB — CBC
HCT: 24.5 % — ABNORMAL LOW (ref 36.0–46.0)
Hemoglobin: 8.1 g/dL — ABNORMAL LOW (ref 12.0–15.0)
MCH: 29.7 pg (ref 26.0–34.0)
MCHC: 33.1 g/dL (ref 30.0–36.0)
MCV: 89.7 fL (ref 80.0–100.0)
Platelets: 120 10*3/uL — ABNORMAL LOW (ref 150–400)
RBC: 2.73 MIL/uL — ABNORMAL LOW (ref 3.87–5.11)
RDW: 15.1 % (ref 11.5–15.5)
WBC: 5.3 10*3/uL (ref 4.0–10.5)
nRBC: 0 % (ref 0.0–0.2)

## 2019-01-24 NOTE — Progress Notes (Addendum)
Pulmonary Critical Care Medicine [place]   PULMONARY CRITICAL CARE SERVICE  PROGRESS NOTE  Date of Service: 01/24/2019  Shamekka Wilhelmy  [id number]  DOB: 1941-04-17   DOA: 01/22/2019  Referring Physician: Merton Border, MD  HPI: Takeema Spegal is a 78 y.o. female seen for follow up of Acute on Chronic Respiratory Failure.  Patient has a 2-hour goal today aerosol trach collar which she has completed with no distress.  Now resting back on pressure support 12/5 and FiO2 30%.  Medications: Reviewed on Rounds  Physical Exam:  Vitals: Pulse 77 respirations 18 BP 93/41 O2 sat 100% temp 95.4  Ventilator Settings no rales or rhonchi noted  . General: Comfortable at this time . Eyes: Grossly normal lids, irises & conjunctiva . ENT: grossly tongue is normal . Neck: no obvious mass . Cardiovascular: S1 S2 normal no gallop . Respiratory: No wheeze or rhonchi noted . Abdomen: soft . Skin: no rash seen on limited exam . Musculoskeletal: not rigid . Psychiatric:unable to assess . Neurologic: no seizure no involuntary movements         Lab Data:   Basic Metabolic Panel: Recent Labs  Lab 01/18/19 0936 01/20/19 0622 01/22/19 0524 01/22/19 1920  NA 134* 132* 135 135  K 3.7 3.8 3.9 3.9  CL 100 99 101 100  CO2 26 23 24 27   GLUCOSE 123* 115* 106* 109*  BUN 46* 69* 41* 27*  CREATININE 2.77* 3.65* 2.56* 1.91*  CALCIUM 8.9 8.7* 9.1 8.6*  PHOS 1.9* 2.7 2.3*  --     ABG: No results for input(s): PHART, PCO2ART, PO2ART, HCO3, O2SAT in the last 168 hours.  Liver Function Tests: Recent Labs  Lab 01/18/19 0936 01/20/19 0622 01/22/19 0524  ALBUMIN 2.2* 2.0* 2.1*   No results for input(s): LIPASE, AMYLASE in the last 168 hours. No results for input(s): AMMONIA in the last 168 hours.  CBC: Recent Labs  Lab 01/22/19 0524 01/22/19 1920 01/23/19 0936 01/23/19 1518 01/24/19 0925  WBC 3.8* 4.3 5.1 5.9 5.3  HGB 7.8* 7.6* 6.5* 8.3* 8.1*  HCT 24.8* 23.3*  20.1* 24.4* 24.5*  MCV 90.5 89.6 90.1 90.0 89.7  PLT 112* 122* 116* 118* 120*    Cardiac Enzymes: No results for input(s): CKTOTAL, CKMB, CKMBINDEX, TROPONINI in the last 168 hours.  BNP (last 3 results) No results for input(s): BNP in the last 8760 hours.  ProBNP (last 3 results) No results for input(s): PROBNP in the last 8760 hours.  Radiological Exams: No results found.  Assessment/Plan Active Problems:   Acute on chronic respiratory failure with hypoxia (HCC)   Chronic kidney disease, stage IV (severe) (HCC)   Acute lower GI bleeding   Bacterial lobar pneumonia   1. Acute on chronic respiratory failure with hypoxia we will continue with pressure support mode titrate oxygen continue pulmonary toilet.  Completed 2 hours on ATC today continue to wean per protocol. 2. Chronic kidney disease stage IV followed by nephrology 3. Acute GI bleed unchanged hemodynamics have been stable labs have been stable although today the hemoglobin is down to 7.1 4. Bacterial lobar pneumonia treated we will continue with supportive care   I have personally seen and evaluated the patient, evaluated laboratory and imaging results, formulated the assessment and plan and placed orders. The Patient requires high complexity decision making for assessment and support.  Case was discussed on Rounds with the Respiratory Therapy Staff  Allyne Gee, MD [facility] Pulmonary Critical Care Medicine Sleep Medicine

## 2019-01-25 DIAGNOSIS — N184 Chronic kidney disease, stage 4 (severe): Secondary | ICD-10-CM | POA: Diagnosis not present

## 2019-01-25 DIAGNOSIS — J9621 Acute and chronic respiratory failure with hypoxia: Secondary | ICD-10-CM | POA: Diagnosis not present

## 2019-01-25 DIAGNOSIS — J159 Unspecified bacterial pneumonia: Secondary | ICD-10-CM | POA: Diagnosis not present

## 2019-01-25 DIAGNOSIS — K922 Gastrointestinal hemorrhage, unspecified: Secondary | ICD-10-CM | POA: Diagnosis not present

## 2019-01-25 NOTE — Progress Notes (Signed)
[facility]  ROUNDING NOTE   Subjective:  Patient due for hemodialysis again today. Remains critically ill. Still on the ventilator at this time.   Objective:  Vital signs in last 24 hours:  Temperature 98.4 pulse 92 respirations 29 blood pressure 103/46   Physical Exam: General: Critically ill-appearing  Head: Endotracheal tube noted to be in place NG in place  Eyes: Anicteric  Neck: Supple, trachea midline  Lungs:  Scattered rhonchi rales, vent assisted  Heart: S1S2 no rubs  Abdomen:  Soft, mild distension  Extremities: 2+ peripheral edema.  Neurologic: Awake, not following commands  Skin: Scattered ecchymoses  Access: Temporary right internal jugular dialysis catheter    Basic Metabolic Panel: Recent Labs  Lab 01/18/19 0936 01/20/19 0622 01/22/19 0524 01/22/19 1920  NA 134* 132* 135 135  K 3.7 3.8 3.9 3.9  CL 100 99 101 100  CO2 26 23 24 27   GLUCOSE 123* 115* 106* 109*  BUN 46* 69* 41* 27*  CREATININE 2.77* 3.65* 2.56* 1.91*  CALCIUM 8.9 8.7* 9.1 8.6*  PHOS 1.9* 2.7 2.3*  --     Liver Function Tests: Recent Labs  Lab 01/18/19 0936 01/20/19 0622 01/22/19 0524  ALBUMIN 2.2* 2.0* 2.1*   No results for input(s): LIPASE, AMYLASE in the last 168 hours. No results for input(s): AMMONIA in the last 168 hours.  CBC: Recent Labs  Lab 01/22/19 0524 01/22/19 1920 01/23/19 0936 01/23/19 1518 01/24/19 0925  WBC 3.8* 4.3 5.1 5.9 5.3  HGB 7.8* 7.6* 6.5* 8.3* 8.1*  HCT 24.8* 23.3* 20.1* 24.4* 24.5*  MCV 90.5 89.6 90.1 90.0 89.7  PLT 112* 122* 116* 118* 120*    Cardiac Enzymes: No results for input(s): CKTOTAL, CKMB, CKMBINDEX, TROPONINI in the last 168 hours.  BNP: Invalid input(s): POCBNP  CBG: No results for input(s): GLUCAP in the last 168 hours.  Microbiology: Results for orders placed or performed during the hospital encounter of 01/12/19  SARS CORONAVIRUS 2 (TAT 6-24 HRS) Nasopharyngeal Nasopharyngeal Swab     Status: None   Collection Time: 01/11/19  2:45 PM   Specimen: Nasopharyngeal Swab  Result Value Ref Range Status   SARS Coronavirus 2 NEGATIVE NEGATIVE Final    Comment: (NOTE) SARS-CoV-2 target nucleic acids are NOT DETECTED. The SARS-CoV-2 RNA is generally detectable in upper and lower respiratory specimens during the acute phase of infection. Negative results do not preclude SARS-CoV-2 infection, do not rule out co-infections with other pathogens, and should not be used as the sole basis for treatment or other patient management decisions. Negative results must be combined with clinical observations, patient history, and epidemiological information. The expected result is Negative. Fact Sheet for Patients: SugarRoll.be Fact Sheet for Healthcare Providers: https://www.woods-mathews.com/ This test is not yet approved or cleared by the [place] FDA and  has been authorized for detection and/or diagnosis of SARS-CoV-2 by FDA under an Emergency Use Authorization (EUA). This EUA will remain  in effect (meaning this test can be used) for the duration of the COVID-19 declaration under Section 56 4(b)(1) of the Act, 21 U.S.C. section 360bbb-3(b)(1), unless the authorization is terminated or revoked sooner. Performed at [facility], [place] [address], [place], [place] [address]     Coagulation Studies: No results for input(s): LABPROT, INR in the last 72 hours.  Urinalysis: No results for input(s): COLORURINE, LABSPEC, PHURINE, GLUCOSEU, HGBUR, BILIRUBINUR, KETONESUR, PROTEINUR, UROBILINOGEN, NITRITE, LEUKOCYTESUR in the last 72 hours.  Invalid input(s): APPERANCEUR    Imaging: No results found.  Medications:       Assessment/ Plan:  78 y.o. female with a PMHx of congestive heart failure, rheumatoid arthritis, chronic kidney disease stage IV, hyperlipidemia, fibromyalgia, giant cell arteritis, hypertension, thoracic ascending aortic  aneurysm, duodenal ulcers, severe diverticulosis, coronary artery disease, hypothyroidism, who was admitted to [facility] on 01/01/2019 for ongoing treatment of acute respiratory failure, generalized debility, and severe renal dysfunction.   1.  Acute renal failure/chronic kidney disease stage IV.  Dialysis orders have been prepared for today.  We will continue dialysis on MWF schedule.  2.  Acute respiratory failure.  Remains on the ventilator at this time.  We have perform dialysis with ultrafiltration to help with weaning.  3.  Anemia chronic kidney disease.  Hemoglobin up slightly to 8.1 as compared to last week.  She did receive blood transfusion last week.  Continue to monitor CBC.    LOS: 0 Breunna Nordmann 10/5/20208:31 AM

## 2019-01-25 NOTE — Progress Notes (Addendum)
Pulmonary Critical Care Medicine [place]   PULMONARY CRITICAL CARE SERVICE  PROGRESS NOTE  Date of Service: 01/25/2019  Klarity Hoelzle  [name]  DOB: [birth year]   DOA: 01/22/2019  Referring Physician: Merton Border, MD  HPI: Zella Aboulhosn is a 78 y.o. female seen for follow up of Acute on Chronic Respiratory Failure.  Patient with 4 hours of aerosol trach collar 25% FiO2 is now back on pressure support satting well.  Medications: Reviewed on Rounds  Physical Exam:  Vitals: Pulse 92 respirations 29 BP 103/46 O2 sat 98% temp 98.4  Ventilator Settings per support of 5 FiO2 30%  . General: Comfortable at this time . Eyes: Grossly normal lids, irises & conjunctiva . ENT: grossly tongue is normal . Neck: no obvious mass . Cardiovascular: S1 S2 normal no gallop . Respiratory: No rales or rhonchi noted . Abdomen: soft . Skin: no rash seen on limited exam . Musculoskeletal: not rigid . Psychiatric:unable to assess . Neurologic: no seizure no involuntary movements         Lab Data:   Basic Metabolic Panel: Recent Labs  Lab 01/20/19 0622 01/22/19 0524 01/22/19 1920  NA 132* 135 135  K 3.8 3.9 3.9  CL 99 101 100  CO2 23 24 27   GLUCOSE 115* 106* 109*  BUN 69* 41* 27*  CREATININE 3.65* 2.56* 1.91*  CALCIUM 8.7* 9.1 8.6*  PHOS 2.7 2.3*  --     ABG: No results for input(s): PHART, PCO2ART, PO2ART, HCO3, O2SAT in the last 168 hours.  Liver Function Tests: Recent Labs  Lab 01/20/19 0622 01/22/19 0524  ALBUMIN 2.0* 2.1*   No results for input(s): LIPASE, AMYLASE in the last 168 hours. No results for input(s): AMMONIA in the last 168 hours.  CBC: Recent Labs  Lab 01/22/19 0524 01/22/19 1920 01/23/19 0936 01/23/19 1518 01/24/19 0925  WBC 3.8* 4.3 5.1 5.9 5.3  HGB 7.8* 7.6* 6.5* 8.3* 8.1*  HCT 24.8* 23.3* 20.1* 24.4* 24.5*  MCV 90.5 89.6 90.1 90.0 89.7  PLT 112* 122* 116* 118* 120*    Cardiac Enzymes: No results for input(s):  CKTOTAL, CKMB, CKMBINDEX, TROPONINI in the last 168 hours.  BNP (last 3 results) No results for input(s): BNP in the last 8760 hours.  ProBNP (last 3 results) No results for input(s): PROBNP in the last 8760 hours.  Radiological Exams: No results found.  Assessment/Plan Active Problems:   Acute on chronic respiratory failure with hypoxia (HCC)   Chronic kidney disease, stage IV (severe) (HCC)   Acute lower GI bleeding   Bacterial lobar pneumonia   1. Acute on chronic respiratory failure with hypoxia we will continue with pressure support mode titrate oxygen continue pulmonary toilet.  Completed 4 hours on ATC today continue to wean per protocol. 2. Chronic kidney disease stage IV followed by nephrology 3. Acute GI bleed unchanged hemodynamics have been stable labs have been stable although today the hemoglobin is down to 7.1 4. Bacterial lobar pneumonia treated we will continue with supportive care   I have personally seen and evaluated the patient, evaluated laboratory and imaging results, formulated the assessment and plan and placed orders. The Patient requires high complexity decision making for assessment and support.  Case was discussed on Rounds with the Respiratory Therapy Staff  Allyne Gee, MD [facility] Pulmonary Critical Care Medicine Sleep Medicine

## 2019-01-26 ENCOUNTER — Encounter (HOSPITAL_COMMUNITY): Payer: Self-pay | Admitting: Otolaryngology

## 2019-01-26 DIAGNOSIS — J9621 Acute and chronic respiratory failure with hypoxia: Secondary | ICD-10-CM | POA: Diagnosis not present

## 2019-01-26 DIAGNOSIS — K922 Gastrointestinal hemorrhage, unspecified: Secondary | ICD-10-CM | POA: Diagnosis not present

## 2019-01-26 DIAGNOSIS — J159 Unspecified bacterial pneumonia: Secondary | ICD-10-CM | POA: Diagnosis not present

## 2019-01-26 DIAGNOSIS — N184 Chronic kidney disease, stage 4 (severe): Secondary | ICD-10-CM | POA: Diagnosis not present

## 2019-01-26 NOTE — Progress Notes (Addendum)
Pulmonary Critical Care Medicine [place]   PULMONARY CRITICAL CARE SERVICE  PROGRESS NOTE  Date of Service: 01/26/2019  Kelcie Banaszak  [id number]  DOB: 07-09-1940   DOA: 01/22/2019  Referring Physician: Merton Border, MD  HPI: Carolyn Schlepp is a 78 y.o. female seen for follow up of Acute on Chronic Respiratory Failure.  Patient has an 8-hour goal today on aerosol trach collar however only lasting 20 minutes before increased work of breathing and an O2 sat of 81% also get back on full support ventilator.  Medications: Reviewed on Rounds  Physical Exam:  Vitals: Pulse 96 respirations 26 BP 140/73 O2 sat 99% to 97.5  Ventilator Settings ventilator mode AC VC rate of 18 tidal volume 400 PEEP of 5 FiO2 28%  . General: Comfortable at this time . Eyes: Grossly normal lids, irises & conjunctiva . ENT: grossly tongue is normal . Neck: no obvious mass . Cardiovascular: S1 S2 normal no gallop . Respiratory: No rales or rhonchi noted . Abdomen: soft . Skin: no rash seen on limited exam . Musculoskeletal: not rigid . Psychiatric:unable to assess . Neurologic: no seizure no involuntary movements         Lab Data:   Basic Metabolic Panel: Recent Labs  Lab 01/20/19 0622 01/22/19 0524 01/22/19 1920  NA 132* 135 135  K 3.8 3.9 3.9  CL 99 101 100  CO2 23 24 27   GLUCOSE 115* 106* 109*  BUN 69* 41* 27*  CREATININE 3.65* 2.56* 1.91*  CALCIUM 8.7* 9.1 8.6*  PHOS 2.7 2.3*  --     ABG: No results for input(s): PHART, PCO2ART, PO2ART, HCO3, O2SAT in the last 168 hours.  Liver Function Tests: Recent Labs  Lab 01/20/19 0622 01/22/19 0524  ALBUMIN 2.0* 2.1*   No results for input(s): LIPASE, AMYLASE in the last 168 hours. No results for input(s): AMMONIA in the last 168 hours.  CBC: Recent Labs  Lab 01/22/19 0524 01/22/19 1920 01/23/19 0936 01/23/19 1518 01/24/19 0925  WBC 3.8* 4.3 5.1 5.9 5.3  HGB 7.8* 7.6* 6.5* 8.3* 8.1*  HCT 24.8* 23.3*  20.1* 24.4* 24.5*  MCV 90.5 89.6 90.1 90.0 89.7  PLT 112* 122* 116* 118* 120*    Cardiac Enzymes: No results for input(s): CKTOTAL, CKMB, CKMBINDEX, TROPONINI in the last 168 hours.  BNP (last 3 results) No results for input(s): BNP in the last 8760 hours.  ProBNP (last 3 results) No results for input(s): PROBNP in the last 8760 hours.  Radiological Exams: No results found.  Assessment/Plan Active Problems:   Acute on chronic respiratory failure with hypoxia (HCC)   Chronic kidney disease, stage IV (severe) (HCC)   Acute lower GI bleeding   Bacterial lobar pneumonia   1. Acute on chronic respiratory failure with hypoxia we will continue with pressure support mode titrate oxygen continue pulmonary toilet.   Patient unable to wean days back on full support ventilator continue supportive measures. 2. Chronic kidney disease stage IV followed by nephrology 3. Acute GI bleed unchanged hemodynamics have been stable labs have been stable although today the hemoglobin is down to 7.1 4. Bacterial lobar pneumonia treated we will continue with supportive care   I have personally seen and evaluated the patient, evaluated laboratory and imaging results, formulated the assessment and plan and placed orders. The Patient requires high complexity decision making for assessment and support.  Case was discussed on Rounds with the Respiratory Therapy Staff  Allyne Gee, MD [facility] Pulmonary Critical Care Medicine  Sleep Medicine

## 2019-01-26 NOTE — Progress Notes (Signed)
PROGRESS NOTE    Stephanie Stark  [name] DOB: Jun 15, 1940 DOA: 01/22/2019   Brief Narrative:  Stephanie Stark is an 78 y.o. female who was [facility].  She has a past medical history of hypertension, hyperlipidemia, hypothyroidism, congestive heart failure with preserved ejection fraction, unspecified coronary artery disease, rheumatoid arthritis who was admitted to the outside hospital with chief complaints of bright lead blood per rectum and hematemesis.  She was recently discharged from another hospital for upper and lower GI bleed and received multiple blood transfusions prior to this admission.  During that admission patient underwent EGD and colonoscopy which showed anterior duodenal bulb ulcer with visible vessel.  Colonoscopy on 11/27/2018 demonstrated moderate diverticulosis through the entire colon with severe diverticulosis in the sigmoid colon.  Patient was treated with Protonix twice daily and soft diet.  During that admission she received multiple PRBCs transfusion for low hemoglobin.  Patient was also intubated and started on TPN.  She was noted to have pancytopenia with suspicious DIC.  Oncology was consulted.  Nephrology was also consulted due to acute kidney injury and the patient continued to have a low urine output.  She was started on Lasix drip with minimal improvement of urine production.  Patient hospital stay was complicated by pneumonia.  She was initially started on IV vancomycin, Zosyn.  Sputum cultures showed MRSA.  Zosyn was discontinued with continuous antibiotic coverage with vancomycin.  She continued to have low urine output, Lasix drip increased by nephrology.  After multiple attempts of spontaneous breathing trial patient was extubated.  However, had to be reintubated due to respiratory distress.  [facility] management and care. She was on treatment with ciprofloxacin for ventilator associated  pneumonia with Citrobacter.  She also was found to have stool C. difficile positive therefore started on p.o. vancomycin.  She currently has a trach in place.  She has DHT.  She also had MRSA bacteremia.   Assessment & Plan:   Acute on chronic respiratory failure with hypoxia (HCC) MRSA bacteremia   Chronic kidney disease, stage IV (severe) (HCC)   Acute lower GI bleeding   Bacterial lobar pneumonia C. difficile colitis Severe cutaneous candidiasis GI bleed Urinary tract infection with yeast Severe protein calorie malnutrition Dysphagia  Acute on chronic respiratory failure with hypoxemia: Ventilator dependent.  Last chest x-ray done on 01/02/2019 per report concern for severe multilobar pneumonia.  Respiratory cultures showing Citrobacter.  Recieved treatment with ciprofloxacin.  However, patient had pneumonia so bacteremia and continued to have fevers.  Therefore change to IV vancomycin, empiric meropenem.  MRSA bacteremia: Continue treatment with IV vancomycin.  Blood cultures from 01/18/2019 did not show blood.  Plan to treat for duration of 2 weeks from the date of negative blood cultures.  Acute on chronic renal failure: Patient likely has stage IV CKD.  Currently on dialysis per nephrology.  Antibiotics renally dosed.  C. difficile colitis: Continue p.o. vancomycin.  Would recommend to continue treatment while on the antibiotics and also for 1 week after stopping the antibiotics.  Severe cutaneous candidiasis: Treated with fluconazole for 5 days.  Also recommend topical antifungal.  GI bleed: Management per the primary team.  UTI: Urine culture showing yeast.  Fluconazole as mentioned above.  Dysphagia: Due to her dysphagia she is very high risk for aspiration and worsening respiratory failure from aspiration.  Thrombocytopenia: Exact etiology unclear.  She is also high risk for bleeding.  On further investigation management per  primary team.  Due to her complex  medical problems she is a very high risk for worsening and decompensation.    Subjective: She is opening eyes but not following commands at this time.   Objective: Temp 98.4, pulse 92, RR 29, BP 103/46, O2 sat 98% Examination: Constitutional:  Thin, frail, ill-appearing female, has DHT Eyes: PERLA  ENMT:  No ear or nose lesions, she has dried crusted blood on the corner of her mouth Neck:  Has trach in place CVS: S1-S2.  No murmur.  Bilateral upper extremity and lower extremity edema  Respiratory: Rhonchi, no wheezing Abdomen: Distended, positive bowel sounds, has cutaneous candidiasis of the groin area Musculoskeletal: ecchymosis of the extremities. Has debility with generalized weakness Neuro: She is more awake today but not following commands.  Unable to do a neurologic exam.    Data Reviewed: I have personally reviewed following labs and imaging studies  CBC: Recent Labs  Lab 01/22/19 0524 01/22/19 1920 01/23/19 0936 01/23/19 1518 01/24/19 0925  WBC 3.8* 4.3 5.1 5.9 5.3  HGB 7.8* 7.6* 6.5* 8.3* 8.1*  HCT 24.8* 23.3* 20.1* 24.4* 24.5*  MCV 90.5 89.6 90.1 90.0 89.7  PLT 112* 122* 116* 118* [id number]*   Basic Metabolic Panel: Recent Labs  Lab 01/20/19 0622 01/22/19 0524 01/22/19 1920  NA 132* 135 135  K 3.8 3.9 3.9  CL 99 101 100  CO2 23 24 27   GLUCOSE 115* 106* 109*  BUN 69* 41* 27*  CREATININE 3.65* 2.56* 1.91*  CALCIUM 8.7* 9.1 8.6*  PHOS 2.7 2.3*  --    GFR: Estimated Creatinine Clearance: 22.7 mL/min (A) (by C-G formula based on SCr of 1.91 mg/dL (H)). Liver Function Tests: Recent Labs  Lab 01/20/19 0622 01/22/19 0524  ALBUMIN 2.0* 2.1*   No results for input(s): LIPASE, AMYLASE in the last 168 hours. No results for input(s): AMMONIA in the last 168 hours. Coagulation Profile: No results for input(s): INR, PROTIME in the last 168 hours. Cardiac Enzymes: No results for input(s): CKTOTAL, CKMB, CKMBINDEX, TROPONINI in the last 168 hours. BNP (last 3  results) No results for input(s): PROBNP in the last 8760 hours. HbA1C: No results for input(s): HGBA1C in the last 72 hours. CBG: No results for input(s): GLUCAP in the last 168 hours. Lipid Profile: No results for input(s): CHOL, HDL, LDLCALC, TRIG, CHOLHDL, LDLDIRECT in the last 72 hours. Thyroid Function Tests: No results for input(s): TSH, T4TOTAL, FREET4, T3FREE, THYROIDAB in the last 72 hours. Anemia Panel: No results for input(s): VITAMINB12, FOLATE, FERRITIN, TIBC, IRON, RETICCTPCT in the last 72 hours. Sepsis Labs: No results for input(s): PROCALCITON, LATICACIDVEN in the last 168 hours.  Recent Results (from the past 240 hour(s))  Culture, blood (routine x 2)     Status: None   Collection Time: 01/18/19  3:45 PM   Specimen: BLOOD RIGHT HAND  Result Value Ref Range Status   Specimen Description BLOOD RIGHT HAND  Final   Special Requests   Final    BOTTLES DRAWN AEROBIC ONLY Blood Culture results may not be optimal due to an inadequate volume of blood received in culture bottles   Culture   Final    NO GROWTH 5 DAYS Performed at [facility], [place] [address] [place], [place] [address]    Report Status 01/23/2019 FINAL  Final  Culture, blood (routine x 2)     Status: None   Collection Time: 01/18/19  4:00 PM   Specimen: BLOOD LEFT HAND  Result Value  Ref Range Status   Specimen Description BLOOD LEFT HAND  Final   Special Requests   Final    BOTTLES DRAWN AEROBIC ONLY Blood Culture adequate volume   Culture   Final    NO GROWTH 5 DAYS Performed at [facility], [address] [place], [place] [address]    Report Status 01/23/2019 FINAL  Final         Radiology Studies: No results found.  Scheduled Meds: Please see MAR  Time spent >30 min, more than 50% of which spent in counseling/coordination of care.  Yaakov Guthrie MD  01/26/2019, 5:10 PM

## 2019-01-27 DIAGNOSIS — J159 Unspecified bacterial pneumonia: Secondary | ICD-10-CM | POA: Diagnosis not present

## 2019-01-27 DIAGNOSIS — K922 Gastrointestinal hemorrhage, unspecified: Secondary | ICD-10-CM | POA: Diagnosis not present

## 2019-01-27 DIAGNOSIS — N184 Chronic kidney disease, stage 4 (severe): Secondary | ICD-10-CM | POA: Diagnosis not present

## 2019-01-27 DIAGNOSIS — J9621 Acute and chronic respiratory failure with hypoxia: Secondary | ICD-10-CM | POA: Diagnosis not present

## 2019-01-27 LAB — RENAL FUNCTION PANEL
Albumin: 2.1 g/dL — ABNORMAL LOW (ref 3.5–5.0)
Anion gap: 10 (ref 5–15)
BUN: 40 mg/dL — ABNORMAL HIGH (ref 8–23)
CO2: 25 mmol/L (ref 22–32)
Calcium: 9.3 mg/dL (ref 8.9–10.3)
Chloride: 95 mmol/L — ABNORMAL LOW (ref 98–111)
Creatinine, Ser: 2.56 mg/dL — ABNORMAL HIGH (ref 0.44–1.00)
GFR calc Af Amer: 20 mL/min — ABNORMAL LOW (ref >60)
GFR calc non Af Amer: 17 mL/min — ABNORMAL LOW (ref >60)
Glucose, Bld: 102 mg/dL — ABNORMAL HIGH (ref 70–99)
Phosphorus: 2.6 mg/dL (ref 2.5–4.6)
Potassium: 4 mmol/L (ref 3.5–5.1)
Sodium: 130 mmol/L — ABNORMAL LOW (ref 135–145)

## 2019-01-27 LAB — CBC
HCT: 22.2 % — ABNORMAL LOW (ref 36.0–46.0)
Hemoglobin: 7.5 g/dL — ABNORMAL LOW (ref 12.0–15.0)
MCH: 30.1 pg (ref 26.0–34.0)
MCHC: 33.8 g/dL (ref 30.0–36.0)
MCV: 89.2 fL (ref 80.0–100.0)
Platelets: 120 10*3/uL — ABNORMAL LOW (ref 150–400)
RBC: 2.49 MIL/uL — ABNORMAL LOW (ref 3.87–5.11)
RDW: 15.2 % (ref 11.5–15.5)
WBC: 4.3 10*3/uL (ref 4.0–10.5)
nRBC: 0 % (ref 0.0–0.2)

## 2019-01-27 LAB — SARS CORONAVIRUS 2 BY RT PCR (HOSPITAL ORDER, PERFORMED IN ~~LOC~~ HOSPITAL LAB): SARS Coronavirus 2: NEGATIVE

## 2019-01-27 LAB — MAGNESIUM: Magnesium: 1.7 mg/dL (ref 1.7–2.4)

## 2019-01-27 NOTE — Progress Notes (Signed)
[facility]  ROUNDING NOTE   Subjective:  Critical illness persist at this time. Patient still on the ventilator. Patient due for dialysis later today.   Objective:  Vital signs in last 24 hours:  Temperature 96.2 pulse 66 respirations 30 blood pressure 114/40 urine output 175 cc.   Physical Exam: General: Critically ill-appearing  Head: Endotracheal tube noted to be in place NG in place  Eyes: Anicteric  Neck: Supple, trachea midline  Lungs:  Scattered rhonchi rales, vent assisted  Heart: S1S2 no rubs  Abdomen:  Soft, mild distension  Extremities: 2+ peripheral edema.  Neurologic: Awake, not following commands  Skin: Scattered ecchymoses  Access: Temporary right internal jugular dialysis catheter    Basic Metabolic Panel: Recent Labs  Lab 01/22/19 0524 01/22/19 1920 01/27/19 0401  NA 135 135 130*  K 3.9 3.9 4.0  CL 101 100 95*  CO2 24 27 25   GLUCOSE 106* 109* 102*  BUN 41* 27* 40*  CREATININE 2.56* 1.91* 2.56*  CALCIUM 9.1 8.6* 9.3  MG  --   --  1.7  PHOS 2.3*  --  2.6    Liver Function Tests: Recent Labs  Lab 01/22/19 0524 01/27/19 0401  ALBUMIN 2.1* 2.1*   No results for input(s): LIPASE, AMYLASE in the last 168 hours. No results for input(s): AMMONIA in the last 168 hours.  CBC: Recent Labs  Lab 01/22/19 1920 01/23/19 0936 01/23/19 1518 01/24/19 0925 01/27/19 0401  WBC 4.3 5.1 5.9 5.3 4.3  HGB 7.6* 6.5* 8.3* 8.1* 7.5*  HCT 23.3* 20.1* 24.4* 24.5* 22.2*  MCV 89.6 90.1 90.0 89.7 89.2  PLT 122* 116* 118* 120* 120*    Cardiac Enzymes: No results for input(s): CKTOTAL, CKMB, CKMBINDEX, TROPONINI in the last 168 hours.  BNP: Invalid input(s): POCBNP  CBG: No results for input(s): GLUCAP in the last 168 hours.  Microbiology: Results for orders placed or performed during the hospital encounter of 01/12/19  SARS CORONAVIRUS 2 (TAT 6-24 HRS) Nasopharyngeal Nasopharyngeal Swab     Status: None   Collection Time: 01/11/19  2:45  PM   Specimen: Nasopharyngeal Swab  Result Value Ref Range Status   SARS Coronavirus 2 NEGATIVE NEGATIVE Final    Comment: (NOTE) SARS-CoV-2 target nucleic acids are NOT DETECTED. The SARS-CoV-2 RNA is generally detectable in upper and lower respiratory specimens during the acute phase of infection. Negative results do not preclude SARS-CoV-2 infection, do not rule out co-infections with other pathogens, and should not be used as the sole basis for treatment or other patient management decisions. Negative results must be combined with clinical observations, patient history, and epidemiological information. The expected result is Negative. Fact Sheet for Patients: SugarRoll.be Fact Sheet for Healthcare Providers: https://www.woods-mathews.com/ This test is not yet approved or cleared by the [place] FDA and  has been authorized for detection and/or diagnosis of SARS-CoV-2 by FDA under an Emergency Use Authorization (EUA). This EUA will remain  in effect (meaning this test can be used) for the duration of the COVID-19 declaration under Section 56 4(b)(1) of the Act, 21 U.S.C. section 360bbb-3(b)(1), unless the authorization is terminated or revoked sooner. Performed at [facility], [address], [place], [place] [address]     Coagulation Studies: No results for input(s): LABPROT, INR in the last 72 hours.  Urinalysis: No results for input(s): COLORURINE, LABSPEC, PHURINE, GLUCOSEU, HGBUR, BILIRUBINUR, KETONESUR, PROTEINUR, UROBILINOGEN, NITRITE, LEUKOCYTESUR in the last 72 hours.  Invalid input(s): APPERANCEUR    Imaging: No results  found.   Medications:       Assessment/ Plan:  78 y.o. female with a PMHx of congestive heart failure, rheumatoid arthritis, chronic kidney disease stage IV, hyperlipidemia, fibromyalgia, giant cell arteritis, hypertension, thoracic ascending aortic aneurysm, duodenal ulcers, severe  diverticulosis, coronary artery disease, hypothyroidism, who was admitted to [facility] on 01/01/2019 for ongoing treatment of acute respiratory failure, generalized debility, and severe renal dysfunction.   1.  Acute renal failure/chronic kidney disease stage IV.  Patient due for dialysis treatment today.  We will continue dialysis at this time as she remains oliguric with urine output of only 175 cc over the preceding 24 hours.  2.  Acute respiratory failure.  Patient being actively followed by pulmonary/critical care.  Continue weaning protocol.  3.  Anemia chronic kidney disease.  Hemoglobin has declined to 7.5.  Consider blood transfusion for hemoglobin of 7 or less.    LOS: 0 Trenae Brunke 10/7/202010:55 AM

## 2019-01-27 NOTE — Progress Notes (Addendum)
Pulmonary Critical Care Medicine [place]   PULMONARY CRITICAL CARE SERVICE  PROGRESS NOTE  Date of Service: 01/27/2019  Stephanie Stark  [id number]  DOB: 12/06/1940   DOA: 01/22/2019  Referring Physician: Merton Border, MD  HPI: Stephanie Stark is a 78 y.o. female seen for follow up of Acute on Chronic Respiratory Failure. PT failed wean to ATC today, and remains on full support on the ventilator.   Medications: Reviewed on Rounds  Physical Exam:  Vitals: Pulse 66, Resp 39, BP 114/40, sat 100%, temp 96.3  Ventilator Settings ACVC Rate 18, TV 400, peep 5, o2 sat 30%  . General: Comfortable at this time . Eyes: Grossly normal lids, irises & conjunctiva . ENT: grossly tongue is normal . Neck: no obvious mass . Cardiovascular: S1 S2 normal no gallop . Respiratory: No rales or ronchi noted . Abdomen: soft . Skin: no rash seen on limited exam . Musculoskeletal: not rigid . Psychiatric:unable to assess . Neurologic: no seizure no involuntary movements         Lab Data:   Basic Metabolic Panel: Recent Labs  Lab 01/22/19 0524 01/22/19 1920 01/27/19 0401  NA 135 135 130*  K 3.9 3.9 4.0  CL 101 100 95*  CO2 24 27 25   GLUCOSE 106* 109* 102*  BUN 41* 27* 40*  CREATININE 2.56* 1.91* 2.56*  CALCIUM 9.1 8.6* 9.3  MG  --   --  1.7  PHOS 2.3*  --  2.6    ABG: No results for input(s): PHART, PCO2ART, PO2ART, HCO3, O2SAT in the last 168 hours.  Liver Function Tests: Recent Labs  Lab 01/22/19 0524 01/27/19 0401  ALBUMIN 2.1* 2.1*   No results for input(s): LIPASE, AMYLASE in the last 168 hours. No results for input(s): AMMONIA in the last 168 hours.  CBC: Recent Labs  Lab 01/22/19 1920 01/23/19 0936 01/23/19 1518 01/24/19 0925 01/27/19 0401  WBC 4.3 5.1 5.9 5.3 4.3  HGB 7.6* 6.5* 8.3* 8.1* 7.5*  HCT 23.3* 20.1* 24.4* 24.5* 22.2*  MCV 89.6 90.1 90.0 89.7 89.2  PLT 122* 116* 118* 120* 120*    Cardiac Enzymes: No results for input(s):  CKTOTAL, CKMB, CKMBINDEX, TROPONINI in the last 168 hours.  BNP (last 3 results) No results for input(s): BNP in the last 8760 hours.  ProBNP (last 3 results) No results for input(s): PROBNP in the last 8760 hours.  Radiological Exams: No results found.  Assessment/Plan Active Problems:   * No active hospital problems. *   1. Acute on chronic respiratory failure with hypoxia we will continue with pressure support mode titrate oxygen continue pulmonary toilet.  Patient unable to wean days back on full support ventilator continue supportive measures. 2. Chronic kidney disease stage IV followed by nephrology 3. Acute GI bleed unchanged hemodynamics have been stable labs have been stable although today the hemoglobin is down to 7.1 4. Bacterial lobar pneumonia treated we will continue with supportive care   I have personally seen and evaluated the patient, evaluated laboratory and imaging results, formulated the assessment and plan and placed orders. The Patient requires high complexity decision making for assessment and support.  Case was discussed on Rounds with the Respiratory Therapy Staff  Allyne Gee, MD [facility] Pulmonary Critical Care Medicine Sleep Medicine

## 2019-01-28 ENCOUNTER — Encounter (HOSPITAL_COMMUNITY): Payer: Self-pay | Admitting: Interventional Radiology

## 2019-01-28 ENCOUNTER — Other Ambulatory Visit (HOSPITAL_COMMUNITY): Payer: Medicare Other

## 2019-01-28 DIAGNOSIS — J159 Unspecified bacterial pneumonia: Secondary | ICD-10-CM | POA: Diagnosis not present

## 2019-01-28 DIAGNOSIS — K922 Gastrointestinal hemorrhage, unspecified: Secondary | ICD-10-CM | POA: Diagnosis not present

## 2019-01-28 DIAGNOSIS — J9621 Acute and chronic respiratory failure with hypoxia: Secondary | ICD-10-CM | POA: Diagnosis not present

## 2019-01-28 DIAGNOSIS — N184 Chronic kidney disease, stage 4 (severe): Secondary | ICD-10-CM | POA: Diagnosis not present

## 2019-01-28 HISTORY — PX: IR GASTROSTOMY TUBE MOD SED: IMG625

## 2019-01-28 LAB — MAGNESIUM: Magnesium: 1.7 mg/dL (ref 1.7–2.4)

## 2019-01-28 MED ORDER — MIDAZOLAM HCL 2 MG/2ML IJ SOLN
INTRAMUSCULAR | Status: AC | PRN
  Administered 2019-01-28: 1 mg via INTRAVENOUS

## 2019-01-28 MED ORDER — CEFAZOLIN SODIUM-DEXTROSE 2-4 GM/100ML-% IV SOLN
2.0000 g | Freq: Once | INTRAVENOUS | Status: AC
  Administered 2019-01-28: 14:00:00 2 g via INTRAVENOUS

## 2019-01-28 MED ORDER — GLUCAGON HCL RDNA (DIAGNOSTIC) 1 MG IJ SOLR
INTRAMUSCULAR | Status: AC | PRN
  Administered 2019-01-28: .5 mg via INTRAVENOUS

## 2019-01-28 MED ORDER — GLUCAGON HCL RDNA (DIAGNOSTIC) 1 MG IJ SOLR
INTRAMUSCULAR | Status: AC
  Filled 2019-01-28: qty 1

## 2019-01-28 MED ORDER — LIDOCAINE HCL 1 % IJ SOLN
INTRAMUSCULAR | Status: AC
  Filled 2019-01-28: qty 20

## 2019-01-28 MED ORDER — IOHEXOL 300 MG/ML  SOLN
50.0000 mL | Freq: Once | INTRAMUSCULAR | Status: AC | PRN
  Administered 2019-01-28: 5 mL

## 2019-01-28 MED ORDER — FENTANYL CITRATE (PF) 100 MCG/2ML IJ SOLN
INTRAMUSCULAR | Status: AC | PRN
  Administered 2019-01-28: 50 ug via INTRAVENOUS

## 2019-01-28 MED ORDER — MIDAZOLAM HCL 2 MG/2ML IJ SOLN
INTRAMUSCULAR | Status: AC
  Filled 2019-01-28: qty 2

## 2019-01-28 MED ORDER — LIDOCAINE HCL 1 % IJ SOLN
INTRAMUSCULAR | Status: AC | PRN
  Administered 2019-01-28: 8 mL

## 2019-01-28 MED ORDER — FENTANYL CITRATE (PF) 100 MCG/2ML IJ SOLN
INTRAMUSCULAR | Status: AC
  Filled 2019-01-28: qty 2

## 2019-01-28 MED ORDER — CEFAZOLIN SODIUM-DEXTROSE 2-4 GM/100ML-% IV SOLN
INTRAVENOUS | Status: AC
  Administered 2019-01-28: 14:00:00 2 g via INTRAVENOUS
  Filled 2019-01-28: qty 100

## 2019-01-28 NOTE — Procedures (Signed)
  Procedure: Percutaneous gastrostomy placement 54f EBL:   minimal Complications:  none immediate  See full dictation in [employer].  Dillard Cannon MD Main # [phone] Pager  [phone]

## 2019-01-28 NOTE — Progress Notes (Addendum)
Pulmonary Critical Care Medicine [place]   PULMONARY CRITICAL CARE SERVICE  PROGRESS NOTE  Date of Service: 01/28/2019  Stephanie Stark  [name]  DOB: 01-06-1941   DOA: 01/22/2019  Referring Physician: Merton Border, MD  HPI: Stephanie Stark is a 78 y.o. female seen for follow up of Acute on Chronic Respiratory Failure. Pt did 6 hours on psp today, and is now back on full support.  Medications: Reviewed on Rounds  Physical Exam:  Vitals: Pulse 91, resp 19, bp 124/71, sat 99%, temp 97.4  Ventilator Settings ACVX+ Resp 18, TV 400, Peep 5, fio2 30%  . General: Comfortable at this time . Eyes: Grossly normal lids, irises & conjunctiva . ENT: grossly tongue is normal . Neck: no obvious mass . Cardiovascular: S1 S2 normal no gallop . Respiratory: no rales or ronchi noted . Abdomen: soft . Skin: no rash seen on limited exam . Musculoskeletal: not rigid . Psychiatric:unable to assess . Neurologic: no seizure no involuntary movements         Lab Data:   Basic Metabolic Panel: Recent Labs  Lab 01/22/19 0524 01/22/19 1920 01/27/19 0401 01/28/19 0358  NA 135 135 130*  --   K 3.9 3.9 4.0  --   CL 101 100 95*  --   CO2 24 27 25   --   GLUCOSE 106* 109* 102*  --   BUN 41* 27* 40*  --   CREATININE 2.56* 1.91* 2.56*  --   CALCIUM 9.1 8.6* 9.3  --   MG  --   --  1.7 1.7  PHOS 2.3*  --  2.6  --     ABG: No results for input(s): PHART, PCO2ART, PO2ART, HCO3, O2SAT in the last 168 hours.  Liver Function Tests: Recent Labs  Lab 01/22/19 0524 01/27/19 0401  ALBUMIN 2.1* 2.1*   No results for input(s): LIPASE, AMYLASE in the last 168 hours. No results for input(s): AMMONIA in the last 168 hours.  CBC: Recent Labs  Lab 01/22/19 1920 01/23/19 0936 01/23/19 1518 01/24/19 0925 01/27/19 0401  WBC 4.3 5.1 5.9 5.3 4.3  HGB 7.6* 6.5* 8.3* 8.1* 7.5*  HCT 23.3* 20.1* 24.4* 24.5* 22.2*  MCV 89.6 90.1 90.0 89.7 89.2  PLT 122* 116* 118* 120* 120*     Cardiac Enzymes: No results for input(s): CKTOTAL, CKMB, CKMBINDEX, TROPONINI in the last 168 hours.  BNP (last 3 results) No results for input(s): BNP in the last 8760 hours.  ProBNP (last 3 results) No results for input(s): PROBNP in the last 8760 hours.  Radiological Exams: Ir Gastrostomy Tube Mod Sed  Result Date: 01/28/2019 CLINICAL DATA:  Acute respiratory failure, chronic ventilatory support, poor PO intake, needs enteral feeding support EXAM: PERC PLACEMENT GASTROSTOMY FLUOROSCOPY TIME:  2.3 minute; 119  uGym2 DAP TECHNIQUE: The procedure, risks, benefits, and alternatives were explained to the family. Questions regarding the procedure were encouraged and answered. The patient understands and consents to the procedure. As antibiotic prophylaxis, cefazolin 2 g was ordered pre-procedure and administered intravenously within one hour of incision. A safe percutaneous approach was confirmed on recent CT abdomen. A 5 French angiographic catheter was placed as orogastric tube. The upper abdomen was prepped with Betadine, draped in usual sterile fashion, and infiltrated locally with 1% lidocaine. Intravenous Fentanyl 66mcg and Versed 1mg  were administered as conscious sedation during continuous monitoring of the patient's level of consciousness and physiological / cardiorespiratory status by the radiology RN, with a total moderate sedation time of 10  minutes. 0.5 mg glucagon given IV to facilitate gastric distention.Stomach was insufflated using air through the orogastric tube. An 64 French sheath needle was advanced percutaneously into the gastric lumen under fluoroscopy. Gas could be aspirated and a small contrast injection confirmed intraluminal spread. The sheath was exchanged over a guidewire for a 9 [place] vascular sheath, through which the snare device was advanced and used to snare a guidewire passed through the orogastric tube. This was withdrawn, and the snare attached to the 20 French  pull-through gastrostomy tube, which was advanced antegrade, positioned with the internal bumper securing the anterior gastric wall to the anterior abdominal wall. Small contrast injection confirms appropriate positioning. The external bumper was applied and the catheter was flushed. COMPLICATIONS: COMPLICATIONS none IMPRESSION: 1. Technically successful 20 French pull-through gastrostomy placement under fluoroscopy. Electronically Signed   By: Lucrezia Europe M.D.   On: 01/28/2019 14:36    Assessment/Plan Active Problems:   * No active hospital problems. *   1. Acute on chronic respiratory failure with hypoxia we will continue with pressure support mode titrate oxygen continue pulmonary toilet.  Patient unable to wean days back on full support ventilator continue supportive measures. 2. Chronic kidney disease stage IV followed by nephrology 3. Acute GI bleed unchanged hemodynamics have been stable labs have been stable although today the hemoglobin is down to 7.1 4. Bacterial lobar pneumonia treated we will continue with supportive care   I have personally seen and evaluated the patient, evaluated laboratory and imaging results, formulated the assessment and plan and placed orders. The Patient requires high complexity decision making for assessment and support.  Case was discussed on Rounds with the Respiratory Therapy Staff  Allyne Gee, MD [facility] Pulmonary Critical Care Medicine Sleep Medicine

## 2019-01-28 NOTE — Sedation Documentation (Signed)
Pt is trached and on a ventilator.  Unable to monitor EtCO2 during procedure

## 2019-01-29 ENCOUNTER — Other Ambulatory Visit (HOSPITAL_COMMUNITY): Payer: Medicare Other

## 2019-01-29 DIAGNOSIS — J159 Unspecified bacterial pneumonia: Secondary | ICD-10-CM | POA: Diagnosis not present

## 2019-01-29 DIAGNOSIS — N184 Chronic kidney disease, stage 4 (severe): Secondary | ICD-10-CM | POA: Diagnosis not present

## 2019-01-29 DIAGNOSIS — J9621 Acute and chronic respiratory failure with hypoxia: Secondary | ICD-10-CM | POA: Diagnosis not present

## 2019-01-29 DIAGNOSIS — K922 Gastrointestinal hemorrhage, unspecified: Secondary | ICD-10-CM | POA: Diagnosis not present

## 2019-01-29 LAB — CBC
HCT: 22.4 % — ABNORMAL LOW (ref 36.0–46.0)
Hemoglobin: 7.1 g/dL — ABNORMAL LOW (ref 12.0–15.0)
MCH: 29.3 pg (ref 26.0–34.0)
MCHC: 31.7 g/dL (ref 30.0–36.0)
MCV: 92.6 fL (ref 80.0–100.0)
Platelets: 122 10*3/uL — ABNORMAL LOW (ref 150–400)
RBC: 2.42 MIL/uL — ABNORMAL LOW (ref 3.87–5.11)
RDW: 15.9 % — ABNORMAL HIGH (ref 11.5–15.5)
WBC: 4 10*3/uL (ref 4.0–10.5)
nRBC: 0 % (ref 0.0–0.2)

## 2019-01-29 LAB — RENAL FUNCTION PANEL
Albumin: 2.1 g/dL — ABNORMAL LOW (ref 3.5–5.0)
Anion gap: 10 (ref 5–15)
BUN: 23 mg/dL (ref 8–23)
CO2: 27 mmol/L (ref 22–32)
Calcium: 9 mg/dL (ref 8.9–10.3)
Chloride: 96 mmol/L — ABNORMAL LOW (ref 98–111)
Creatinine, Ser: 2.1 mg/dL — ABNORMAL HIGH (ref 0.44–1.00)
GFR calc Af Amer: 25 mL/min — ABNORMAL LOW (ref >60)
GFR calc non Af Amer: 22 mL/min — ABNORMAL LOW (ref >60)
Glucose, Bld: 71 mg/dL (ref 70–99)
Phosphorus: 2.6 mg/dL (ref 2.5–4.6)
Potassium: 3.9 mmol/L (ref 3.5–5.1)
Sodium: 133 mmol/L — ABNORMAL LOW (ref 135–145)

## 2019-01-29 LAB — MAGNESIUM: Magnesium: 1.9 mg/dL (ref 1.7–2.4)

## 2019-01-29 LAB — VANCOMYCIN, TROUGH: Vancomycin Tr: 23 ug/mL (ref 15–20)

## 2019-01-29 NOTE — Progress Notes (Addendum)
Pulmonary Critical Care Medicine [place]   PULMONARY CRITICAL CARE SERVICE  PROGRESS NOTE  Date of Service: 01/29/2019  Stephanie Stark  [name]  DOB: [birth year]   DOA: 01/22/2019  Referring Physician: Merton Border, MD  HPI: Stephanie Stark is a 78 y.o. female seen for follow up of Acute on Chronic Respiratory Failure.  Patient was unable to wean to pressure support today remains on full support on assist-control rate of 18 with FiO2 30%.  Medications: Reviewed on Rounds  Physical Exam:  Vitals: Pulse 70 respirations 18 BP 108/52 O2 sat 100% temp 93  Ventilator Settings ventilator mode AC VC plus rate of 18 tidal volume 400 PEEP of 5 FiO2 28%  . General: Comfortable at this time . Eyes: Grossly normal lids, irises & conjunctiva . ENT: grossly tongue is normal . Neck: no obvious mass . Cardiovascular: S1 S2 normal no gallop . Respiratory: No rales or rhonchi noted . Abdomen: soft . Skin: no rash seen on limited exam . Musculoskeletal: not rigid . Psychiatric:unable to assess . Neurologic: no seizure no involuntary movements         Lab Data:   Basic Metabolic Panel: Recent Labs  Lab 01/22/19 1920 01/27/19 0401 01/28/19 0358 01/29/19 0500  NA 135 130*  --  133*  K 3.9 4.0  --  3.9  CL 100 95*  --  96*  CO2 27 25  --  27  GLUCOSE 109* 102*  --  71  BUN 27* 40*  --  23  CREATININE 1.91* 2.56*  --  2.10*  CALCIUM 8.6* 9.3  --  9.0  MG  --  1.7 1.7 1.9  PHOS  --  2.6  --  2.6    ABG: No results for input(s): PHART, PCO2ART, PO2ART, HCO3, O2SAT in the last 168 hours.  Liver Function Tests: Recent Labs  Lab 01/27/19 0401 01/29/19 0500  ALBUMIN 2.1* 2.1*   No results for input(s): LIPASE, AMYLASE in the last 168 hours. No results for input(s): AMMONIA in the last 168 hours.  CBC: Recent Labs  Lab 01/23/19 0936 01/23/19 1518 01/24/19 0925 01/27/19 0401 01/29/19 0500  WBC 5.1 5.9 5.3 4.3 4.0  HGB 6.5* 8.3* 8.1* 7.5* 7.1*   HCT 20.1* 24.4* 24.5* 22.2* 22.4*  MCV 90.1 90.0 89.7 89.2 92.6  PLT 116* 118* 120* 120* 122*    Cardiac Enzymes: No results for input(s): CKTOTAL, CKMB, CKMBINDEX, TROPONINI in the last 168 hours.  BNP (last 3 results) No results for input(s): BNP in the last 8760 hours.  ProBNP (last 3 results) No results for input(s): PROBNP in the last 8760 hours.  Radiological Exams: Ir Gastrostomy Tube Mod Sed  Result Date: 01/28/2019 CLINICAL DATA:  Acute respiratory failure, chronic ventilatory support, poor PO intake, needs enteral feeding support EXAM: PERC PLACEMENT GASTROSTOMY FLUOROSCOPY TIME:  2.3 minute; 119  uGym2 DAP TECHNIQUE: The procedure, risks, benefits, and alternatives were explained to the family. Questions regarding the procedure were encouraged and answered. The patient understands and consents to the procedure. As antibiotic prophylaxis, cefazolin 2 g was ordered pre-procedure and administered intravenously within one hour of incision. A safe percutaneous approach was confirmed on recent CT abdomen. A 5 French angiographic catheter was placed as orogastric tube. The upper abdomen was prepped with Betadine, draped in usual sterile fashion, and infiltrated locally with 1% lidocaine. Intravenous Fentanyl 56mcg and Versed 1mg  were administered as conscious sedation during continuous monitoring of the patient's level of consciousness and physiological /  cardiorespiratory status by the radiology RN, with a total moderate sedation time of 10 minutes. 0.5 mg glucagon given IV to facilitate gastric distention.Stomach was insufflated using air through the orogastric tube. An 44 French sheath needle was advanced percutaneously into the gastric lumen under fluoroscopy. Gas could be aspirated and a small contrast injection confirmed intraluminal spread. The sheath was exchanged over a guidewire for a 9 [place] vascular sheath, through which the snare device was advanced and used to snare a guidewire  passed through the orogastric tube. This was withdrawn, and the snare attached to the 20 French pull-through gastrostomy tube, which was advanced antegrade, positioned with the internal bumper securing the anterior gastric wall to the anterior abdominal wall. Small contrast injection confirms appropriate positioning. The external bumper was applied and the catheter was flushed. COMPLICATIONS: COMPLICATIONS none IMPRESSION: 1. Technically successful 20 French pull-through gastrostomy placement under fluoroscopy. Electronically Signed   By: Lucrezia Europe M.D.   On: 01/28/2019 14:36    Assessment/Plan Active Problems:   Acute on chronic respiratory failure with hypoxia (HCC)   Chronic kidney disease, stage IV (severe) (HCC)   Acute lower GI bleeding   Bacterial lobar pneumonia   1. Acute on chronic respiratory failure with hypoxia patient will continue on full support at this time assist-control mode rate 18 tidal line 400 PEEP 5 FiO2 28%.  We will continue weaning per protocol.  Continue supportive measures pulmonary toilet. 2. Chronic kidney disease stage IV followed by nephrology 3. Acute GI bleed unchanged hemodynamics have been stable labs have been stable although today the hemoglobin is down to 7.1 4. Bacterial lobar pneumonia treated we will continue with supportive care   I have personally seen and evaluated the patient, evaluated laboratory and imaging results, formulated the assessment and plan and placed orders. The Patient requires high complexity decision making for assessment and support.  Case was discussed on Rounds with the Respiratory Therapy Staff  Allyne Gee, MD [facility] Pulmonary Critical Care Medicine Sleep Medicine

## 2019-01-29 NOTE — Progress Notes (Signed)
[facility]  ROUNDING NOTE   Subjective:  Patient seen at bedside. Seen and evaluated during dialysis treatment. Tolerating well.   Objective:  Vital signs in last 24 hours:  Temperature 96.3 pulse 93 respirations 18 blood pressure 118/52   Physical Exam: General: Critically ill-appearing  Head: Endotracheal tube noted to be in place NG in place  Eyes: Anicteric  Neck: Supple, trachea midline  Lungs:  Scattered rhonchi rales, vent assisted  Heart: S1S2 no rubs  Abdomen:  Soft, mild distension  Extremities: 2+ peripheral edema.  Neurologic: Awake, not following commands  Skin: Scattered ecchymoses  Access: Temporary right internal jugular dialysis catheter    Basic Metabolic Panel: Recent Labs  Lab 01/22/19 1920 01/27/19 0401 01/28/19 0358 01/29/19 0500  NA 135 130*  --  133*  K 3.9 4.0  --  3.9  CL 100 95*  --  96*  CO2 27 25  --  27  GLUCOSE 109* 102*  --  71  BUN 27* 40*  --  23  CREATININE 1.91* 2.56*  --  2.10*  CALCIUM 8.6* 9.3  --  9.0  MG  --  1.7 1.7 1.9  PHOS  --  2.6  --  2.6    Liver Function Tests: Recent Labs  Lab 01/27/19 0401 01/29/19 0500  ALBUMIN 2.1* 2.1*   No results for input(s): LIPASE, AMYLASE in the last 168 hours. No results for input(s): AMMONIA in the last 168 hours.  CBC: Recent Labs  Lab 01/23/19 0936 01/23/19 1518 01/24/19 0925 01/27/19 0401 01/29/19 0500  WBC 5.1 5.9 5.3 4.3 4.0  HGB 6.5* 8.3* 8.1* 7.5* 7.1*  HCT 20.1* 24.4* 24.5* 22.2* 22.4*  MCV 90.1 90.0 89.7 89.2 92.6  PLT 116* 118* 120* 120* 122*    Cardiac Enzymes: No results for input(s): CKTOTAL, CKMB, CKMBINDEX, TROPONINI in the last 168 hours.  BNP: Invalid input(s): POCBNP  CBG: No results for input(s): GLUCAP in the last 168 hours.  Microbiology: Results for orders placed or performed during the hospital encounter of 01/22/19  SARS Coronavirus 2 [facility] order, Performed in [facility] hospital lab) Nasopharyngeal Nasopharyngeal  Swab     Status: None   Collection Time: 01/27/19  2:40 PM   Specimen: Nasopharyngeal Swab  Result Value Ref Range Status   SARS Coronavirus 2 NEGATIVE NEGATIVE Final    Comment: (NOTE) If result is NEGATIVE SARS-CoV-2 target nucleic acids are NOT DETECTED. The SARS-CoV-2 RNA is generally detectable in upper and lower  respiratory specimens during the acute phase of infection. The lowest  concentration of SARS-CoV-2 viral copies this assay can detect is 250  copies / mL. A negative result does not preclude SARS-CoV-2 infection  and should not be used as the sole basis for treatment or other  patient management decisions.  A negative result may occur with  improper specimen collection / handling, submission of specimen other  than nasopharyngeal swab, presence of viral mutation(s) within the  areas targeted by this assay, and inadequate number of viral copies  (<250 copies / mL). A negative result must be combined with clinical  observations, patient history, and epidemiological information. If result is POSITIVE SARS-CoV-2 target nucleic acids are DETECTED. The SARS-CoV-2 RNA is generally detectable in upper and lower  respiratory specimens dur ing the acute phase of infection.  Positive  results are indicative of active infection with SARS-CoV-2.  Clinical  correlation with patient history and other diagnostic information is  necessary to determine patient infection status.  Positive results do  not rule out bacterial infection or co-infection with other viruses. If result is PRESUMPTIVE POSTIVE SARS-CoV-2 nucleic acids MAY BE PRESENT.   A presumptive positive result was obtained on the submitted specimen  and confirmed on repeat testing.  While 2019 novel coronavirus  (SARS-CoV-2) nucleic acids may be present in the submitted sample  additional confirmatory testing may be necessary for epidemiological  and / or clinical management purposes  to differentiate between  SARS-CoV-2  and other Sarbecovirus currently known to infect humans.  If clinically indicated additional testing with an alternate test  methodology [phone]) is advised. The SARS-CoV-2 RNA is generally  detectable in upper and lower respiratory sp ecimens during the acute  phase of infection. The expected result is Negative. Fact Sheet for Patients:  StrictlyIdeas.no Fact Sheet for Healthcare Providers: BankingDealers.co.za This test is not yet approved or cleared by the [place] FDA and has been authorized for detection and/or diagnosis of SARS-CoV-2 by FDA under an Emergency Use Authorization (EUA).  This EUA will remain in effect (meaning this test can be used) for the duration of the COVID-19 declaration under Section 564(b)(1) of the Act, 21 U.S.C. section 360bbb-3(b)(1), unless the authorization is terminated or revoked sooner. Performed at [facility], [place] [address] [place], [place] [address]     Coagulation Studies: No results for input(s): LABPROT, INR in the last 72 hours.  Urinalysis: No results for input(s): COLORURINE, LABSPEC, PHURINE, GLUCOSEU, HGBUR, BILIRUBINUR, KETONESUR, PROTEINUR, UROBILINOGEN, NITRITE, LEUKOCYTESUR in the last 72 hours.  Invalid input(s): APPERANCEUR    Imaging: Ir Gastrostomy Tube Mod Sed  Result Date: 01/28/2019 CLINICAL DATA:  Acute respiratory failure, chronic ventilatory support, poor PO intake, needs enteral feeding support EXAM: PERC PLACEMENT GASTROSTOMY FLUOROSCOPY TIME:  2.3 minute; 119  uGym2 DAP TECHNIQUE: The procedure, risks, benefits, and alternatives were explained to the family. Questions regarding the procedure were encouraged and answered. The patient understands and consents to the procedure. As antibiotic prophylaxis, cefazolin 2 g was ordered pre-procedure and administered intravenously within one hour of incision. A safe percutaneous approach was confirmed on recent CT  abdomen. A 5 French angiographic catheter was placed as orogastric tube. The upper abdomen was prepped with Betadine, draped in usual sterile fashion, and infiltrated locally with 1% lidocaine. Intravenous Fentanyl 35mcg and Versed 1mg  were administered as conscious sedation during continuous monitoring of the patient's level of consciousness and physiological / cardiorespiratory status by the radiology RN, with a total moderate sedation time of 10 minutes. 0.5 mg glucagon given IV to facilitate gastric distention.Stomach was insufflated using air through the orogastric tube. An 15 French sheath needle was advanced percutaneously into the gastric lumen under fluoroscopy. Gas could be aspirated and a small contrast injection confirmed intraluminal spread. The sheath was exchanged over a guidewire for a 9 [place] vascular sheath, through which the snare device was advanced and used to snare a guidewire passed through the orogastric tube. This was withdrawn, and the snare attached to the 20 French pull-through gastrostomy tube, which was advanced antegrade, positioned with the internal bumper securing the anterior gastric wall to the anterior abdominal wall. Small contrast injection confirms appropriate positioning. The external bumper was applied and the catheter was flushed. COMPLICATIONS: COMPLICATIONS none IMPRESSION: 1. Technically successful 20 French pull-through gastrostomy placement under fluoroscopy. Electronically Signed   By: Lucrezia Europe M.D.   On: 01/28/2019 14:36     Medications:       Assessment/ Plan:  78 y.o. female  with a PMHx of congestive heart failure, rheumatoid arthritis, chronic kidney disease stage IV, hyperlipidemia, fibromyalgia, giant cell arteritis, hypertension, thoracic ascending aortic aneurysm, duodenal ulcers, severe diverticulosis, coronary artery disease, hypothyroidism, who was admitted to [facility] on 01/01/2019 for ongoing treatment of acute respiratory failure, generalized  debility, and severe renal dysfunction.   1.  Acute renal failure/chronic kidney disease stage IV.  Patient seen and evaluated during hemodialysis.  Tolerating well.  We will plan to complete dialysis treatment today and thereafter next treatment will be on Monday.  2.  Acute respiratory failure.  Patient remains on the ventilator.  FiO2 currently 28%.  Weaning as per pulmonary/critical care.  3.  Anemia chronic kidney disease.  Hemoglobin now down to 7.1.  Continue to monitor closely.  Consider blood transfusion for hemoglobin of 7 or less but defer to primary team.    LOS: 0 Stephanie Stark 10/9/20208:21 AM

## 2019-01-30 LAB — CBC
HCT: 24.1 % — ABNORMAL LOW (ref 36.0–46.0)
Hemoglobin: 7.6 g/dL — ABNORMAL LOW (ref 12.0–15.0)
MCH: 29.3 pg (ref 26.0–34.0)
MCHC: 31.5 g/dL (ref 30.0–36.0)
MCV: 93.1 fL (ref 80.0–100.0)
Platelets: 127 10*3/uL — ABNORMAL LOW (ref 150–400)
RBC: 2.59 MIL/uL — ABNORMAL LOW (ref 3.87–5.11)
RDW: 16 % — ABNORMAL HIGH (ref 11.5–15.5)
WBC: 5 10*3/uL (ref 4.0–10.5)
nRBC: 0 % (ref 0.0–0.2)

## 2019-02-01 DIAGNOSIS — N184 Chronic kidney disease, stage 4 (severe): Secondary | ICD-10-CM | POA: Diagnosis not present

## 2019-02-01 DIAGNOSIS — J9621 Acute and chronic respiratory failure with hypoxia: Secondary | ICD-10-CM | POA: Diagnosis not present

## 2019-02-01 DIAGNOSIS — K922 Gastrointestinal hemorrhage, unspecified: Secondary | ICD-10-CM | POA: Diagnosis not present

## 2019-02-01 DIAGNOSIS — J159 Unspecified bacterial pneumonia: Secondary | ICD-10-CM | POA: Diagnosis not present

## 2019-02-01 LAB — CBC
HCT: 22.9 % — ABNORMAL LOW (ref 36.0–46.0)
Hemoglobin: 7.4 g/dL — ABNORMAL LOW (ref 12.0–15.0)
MCH: 29.4 pg (ref 26.0–34.0)
MCHC: 32.3 g/dL (ref 30.0–36.0)
MCV: 90.9 fL (ref 80.0–100.0)
Platelets: 132 10*3/uL — ABNORMAL LOW (ref 150–400)
RBC: 2.52 MIL/uL — ABNORMAL LOW (ref 3.87–5.11)
RDW: 15.8 % — ABNORMAL HIGH (ref 11.5–15.5)
WBC: 6.5 10*3/uL (ref 4.0–10.5)
nRBC: 0 % (ref 0.0–0.2)

## 2019-02-01 LAB — RENAL FUNCTION PANEL
Albumin: 2.1 g/dL — ABNORMAL LOW (ref 3.5–5.0)
Anion gap: 9 (ref 5–15)
BUN: 31 mg/dL — ABNORMAL HIGH (ref 8–23)
CO2: 27 mmol/L (ref 22–32)
Calcium: 9.4 mg/dL (ref 8.9–10.3)
Chloride: 95 mmol/L — ABNORMAL LOW (ref 98–111)
Creatinine, Ser: 2.6 mg/dL — ABNORMAL HIGH (ref 0.44–1.00)
GFR calc Af Amer: 20 mL/min — ABNORMAL LOW (ref >60)
GFR calc non Af Amer: 17 mL/min — ABNORMAL LOW (ref >60)
Glucose, Bld: 93 mg/dL (ref 70–99)
Phosphorus: 1.6 mg/dL — ABNORMAL LOW (ref 2.5–4.6)
Potassium: 4.1 mmol/L (ref 3.5–5.1)
Sodium: 131 mmol/L — ABNORMAL LOW (ref 135–145)

## 2019-02-01 NOTE — Progress Notes (Signed)
[facility]  ROUNDING NOTE   Subjective:  Patient seen and evaluated during hemodialysis. Tolerating well. Remains critically ill. On ventilator. FiO2 28%.   Objective:  Vital signs in last 24 hours:  Temperature 96.7 pulse 91 respirations 25 blood pressure 124/57   Physical Exam: General: Critically ill-appearing  Head: Normocephalic/atraumatic  Eyes: Anicteric  Neck: Supple, trachea midline  Lungs:  Scattered rhonchi rales, vent assisted  Heart: S1S2 no rubs  Abdomen:  Soft, bowel sounds present, PEG tube noted  Extremities: 2+ peripheral edema.  Neurologic: Awake, follows simple commands  Skin: Scattered ecchymoses  Access: Temporary right internal jugular dialysis catheter    Basic Metabolic Panel: Recent Labs  Lab 01/27/19 0401 01/28/19 0358 01/29/19 0500  NA 130*  --  133*  K 4.0  --  3.9  CL 95*  --  96*  CO2 25  --  27  GLUCOSE 102*  --  71  BUN 40*  --  23  CREATININE 2.56*  --  2.10*  CALCIUM 9.3  --  9.0  MG 1.7 1.7 1.9  PHOS 2.6  --  2.6    Liver Function Tests: Recent Labs  Lab 01/27/19 0401 01/29/19 0500  ALBUMIN 2.1* 2.1*   No results for input(s): LIPASE, AMYLASE in the last 168 hours. No results for input(s): AMMONIA in the last 168 hours.  CBC: Recent Labs  Lab 01/27/19 0401 01/29/19 0500 01/30/19 0459 02/01/19 0831  WBC 4.3 4.0 5.0 6.5  HGB 7.5* 7.1* 7.6* 7.4*  HCT 22.2* 22.4* 24.1* 22.9*  MCV 89.2 92.6 93.1 90.9  PLT 120* 122* 127* 132*    Cardiac Enzymes: No results for input(s): CKTOTAL, CKMB, CKMBINDEX, TROPONINI in the last 168 hours.  BNP: Invalid input(s): POCBNP  CBG: No results for input(s): GLUCAP in the last 168 hours.  Microbiology: Results for orders placed or performed during the hospital encounter of 01/22/19  SARS Coronavirus 2 [facility] order, Performed in [facility] hospital lab) Nasopharyngeal Nasopharyngeal Swab     Status: None   Collection Time: 01/27/19  2:40 PM   Specimen:  Nasopharyngeal Swab  Result Value Ref Range Status   SARS Coronavirus 2 NEGATIVE NEGATIVE Final    Comment: (NOTE) If result is NEGATIVE SARS-CoV-2 target nucleic acids are NOT DETECTED. The SARS-CoV-2 RNA is generally detectable in upper and lower  respiratory specimens during the acute phase of infection. The lowest  concentration of SARS-CoV-2 viral copies this assay can detect is 250  copies / mL. A negative result does not preclude SARS-CoV-2 infection  and should not be used as the sole basis for treatment or other  patient management decisions.  A negative result may occur with  improper specimen collection / handling, submission of specimen other  than nasopharyngeal swab, presence of viral mutation(s) within the  areas targeted by this assay, and inadequate number of viral copies  (<250 copies / mL). A negative result must be combined with clinical  observations, patient history, and epidemiological information. If result is POSITIVE SARS-CoV-2 target nucleic acids are DETECTED. The SARS-CoV-2 RNA is generally detectable in upper and lower  respiratory specimens dur ing the acute phase of infection.  Positive  results are indicative of active infection with SARS-CoV-2.  Clinical  correlation with patient history and other diagnostic information is  necessary to determine patient infection status.  Positive results do  not rule out bacterial infection or co-infection with other viruses. If result is PRESUMPTIVE POSTIVE SARS-CoV-2 nucleic acids MAY BE PRESENT.  A presumptive positive result was obtained on the submitted specimen  and confirmed on repeat testing.  While 2019 novel coronavirus  (SARS-CoV-2) nucleic acids may be present in the submitted sample  additional confirmatory testing may be necessary for epidemiological  and / or clinical management purposes  to differentiate between  SARS-CoV-2 and other Sarbecovirus currently known to infect humans.  If clinically  indicated additional testing with an alternate test  methodology [phone] is advised. The SARS-CoV-2 RNA is generally  detectable in upper and lower respiratory sp ecimens during the acute  phase of infection. The expected result is Negative. Fact Sheet for Patients:  StrictlyIdeas.no Fact Sheet for Healthcare Providers: BankingDealers.co.za This test is not yet approved or cleared by the [place] FDA and has been authorized for detection and/or diagnosis of SARS-CoV-2 by FDA under an Emergency Use Authorization (EUA).  This EUA will remain in effect (meaning this test can be used) for the duration of the COVID-19 declaration under Section 564(b)(1) of the Act, 21 U.S.C. section 360bbb-3(b)(1), unless the authorization is terminated or revoked sooner. Performed at [facility], [place] [address], [place], [place] [address]     Coagulation Studies: No results for input(s): LABPROT, INR in the last 72 hours.  Urinalysis: No results for input(s): COLORURINE, LABSPEC, PHURINE, GLUCOSEU, HGBUR, BILIRUBINUR, KETONESUR, PROTEINUR, UROBILINOGEN, NITRITE, LEUKOCYTESUR in the last 72 hours.  Invalid input(s): APPERANCEUR    Imaging: No results found.   Medications:       Assessment/ Plan:  78 y.o. female with a PMHx of congestive heart failure, rheumatoid arthritis, chronic kidney disease stage IV, hyperlipidemia, fibromyalgia, giant cell arteritis, hypertension, thoracic ascending aortic aneurysm, duodenal ulcers, severe diverticulosis, coronary artery disease, hypothyroidism, who was admitted to [facility] on 01/01/2019 for ongoing treatment of acute respiratory failure, generalized debility, and severe renal dysfunction.   1.  Acute renal failure/chronic kidney disease stage IV.  Patient seen during dialysis.  Blood flow rate currently 350 with ultrafiltration target of 2 kg.  We plan to complete dialysis treatment today.  2.   Acute respiratory failure.  FiO2 at the moment is 28%.  Continue ventilatory support at this time.  3.  Anemia chronic kidney disease.  Hemoglobin has come up a bit to 7.4.  Continue to monitor closely.    LOS: 0 Stephanie Stark 10/12/20209:17 AM

## 2019-02-01 NOTE — Progress Notes (Signed)
Pulmonary Critical Care Medicine [place]   PULMONARY CRITICAL CARE SERVICE  PROGRESS NOTE  Date of Service: 02/01/2019  Stephanie Stark  [name]  DOB: [birth year]   DOA: 01/22/2019  Referring Physician: Merton Border, MD  HPI: Stephanie Stark is a 78 y.o. female seen for follow up of Acute on Chronic Respiratory Failure.  Patient currently is on pressure support mode has been on 28% FiO2 and is doing okay with the weaning so far  Medications: Reviewed on Rounds  Physical Exam:  Vitals: Temperature 96.7 pulse 91 respiratory rate 20 blood pressure 120/51 saturations 98%  Ventilator Settings ventilation pressure support FiO2 28% pressure support 12/5  . General: Comfortable at this time . Eyes: Grossly normal lids, irises & conjunctiva . ENT: grossly tongue is normal . Neck: no obvious mass . Cardiovascular: S1 S2 normal no gallop . Respiratory: No rhonchi coarse breath sounds . Abdomen: soft . Skin: no rash seen on limited exam . Musculoskeletal: not rigid . Psychiatric:unable to assess . Neurologic: no seizure no involuntary movements         Lab Data:   Basic Metabolic Panel: Recent Labs  Lab 01/27/19 0401 01/28/19 0358 01/29/19 0500 02/01/19 0831  NA 130*  --  133* 131*  K 4.0  --  3.9 4.1  CL 95*  --  96* 95*  CO2 25  --  27 27  GLUCOSE 102*  --  71 93  BUN 40*  --  23 31*  CREATININE 2.56*  --  2.10* 2.60*  CALCIUM 9.3  --  9.0 9.4  MG 1.7 1.7 1.9  --   PHOS 2.6  --  2.6 1.6*    ABG: No results for input(s): PHART, PCO2ART, PO2ART, HCO3, O2SAT in the last 168 hours.  Liver Function Tests: Recent Labs  Lab 01/27/19 0401 01/29/19 0500 02/01/19 0831  ALBUMIN 2.1* 2.1* 2.1*   No results for input(s): LIPASE, AMYLASE in the last 168 hours. No results for input(s): AMMONIA in the last 168 hours.  CBC: Recent Labs  Lab 01/27/19 0401 01/29/19 0500 01/30/19 0459 02/01/19 0831  WBC 4.3 4.0 5.0 6.5  HGB 7.5* 7.1* 7.6*  7.4*  HCT 22.2* 22.4* 24.1* 22.9*  MCV 89.2 92.6 93.1 90.9  PLT 120* 122* 127* 132*    Cardiac Enzymes: No results for input(s): CKTOTAL, CKMB, CKMBINDEX, TROPONINI in the last 168 hours.  BNP (last 3 results) No results for input(s): BNP in the last 8760 hours.  ProBNP (last 3 results) No results for input(s): PROBNP in the last 8760 hours.  Radiological Exams: No results found.  Assessment/Plan Active Problems:   Acute on chronic respiratory failure with hypoxia (HCC)   Chronic kidney disease, stage IV (severe) (HCC)   Acute lower GI bleeding   Bacterial lobar pneumonia   1. Acute on chronic respiratory failure with hypoxia we will continue with pressure support mode currently is on 28% FiO2 patient is on pressure support 12/5 2. Chronic kidney disease stage IV we will continue with supportive care 3. Acute lower GI bleed no active bleeding noted right now 4. Bacterial pneumonia treated we will continue to monitor radiologically   I have personally seen and evaluated the patient, evaluated laboratory and imaging results, formulated the assessment and plan and placed orders. The Patient requires high complexity decision making for assessment and support.  Case was discussed on Rounds with the Respiratory Therapy Staff  Allyne Gee, MD [facility] Pulmonary Critical Care Medicine Sleep Medicine

## 2019-02-02 DIAGNOSIS — J9621 Acute and chronic respiratory failure with hypoxia: Secondary | ICD-10-CM | POA: Diagnosis not present

## 2019-02-02 DIAGNOSIS — N184 Chronic kidney disease, stage 4 (severe): Secondary | ICD-10-CM | POA: Diagnosis not present

## 2019-02-02 DIAGNOSIS — J159 Unspecified bacterial pneumonia: Secondary | ICD-10-CM | POA: Diagnosis not present

## 2019-02-02 DIAGNOSIS — K922 Gastrointestinal hemorrhage, unspecified: Secondary | ICD-10-CM | POA: Diagnosis not present

## 2019-02-02 LAB — HEPATITIS B SURFACE ANTIBODY, QUANTITATIVE: Hep B S AB Quant (Post): <3.1 m[IU]/mL — ABNORMAL LOW (ref >9.9)

## 2019-02-02 NOTE — Progress Notes (Signed)
Pulmonary Critical Care Medicine [place]   PULMONARY CRITICAL CARE SERVICE  PROGRESS NOTE  Date of Service: 02/02/2019  Chadsity Sowder  [name]  DOB: 12-14-1940   DOA: 01/22/2019  Referring Physician: Merton Border, MD  HPI: Terese Vi is a 78 y.o. female seen for follow up of Acute on Chronic Respiratory Failure.  Patient at this time is on pressure support mode has been on 28% FiO2 the goal is to try for 16 hours  Medications: Reviewed on Rounds  Physical Exam:  Vitals: Temperature 97.6 pulse 80 respiratory rate 16 blood pressure is 128/75 saturations 100%  Ventilator Settings mode ventilation pressure support FiO2 28% pressure 12/5  . General: Comfortable at this time . Eyes: Grossly normal lids, irises & conjunctiva . ENT: grossly tongue is normal . Neck: no obvious mass . Cardiovascular: S1 S2 normal no gallop . Respiratory: No rhonchi no rales are noted at this time . Abdomen: soft . Skin: no rash seen on limited exam . Musculoskeletal: not rigid . Psychiatric:unable to assess . Neurologic: no seizure no involuntary movements         Lab Data:   Basic Metabolic Panel: Recent Labs  Lab 01/27/19 0401 01/28/19 0358 01/29/19 0500 02/01/19 0831  NA 130*  --  133* 131*  K 4.0  --  3.9 4.1  CL 95*  --  96* 95*  CO2 25  --  27 27  GLUCOSE 102*  --  71 93  BUN 40*  --  23 31*  CREATININE 2.56*  --  2.10* 2.60*  CALCIUM 9.3  --  9.0 9.4  MG 1.7 1.7 1.9  --   PHOS 2.6  --  2.6 1.6*    ABG: No results for input(s): PHART, PCO2ART, PO2ART, HCO3, O2SAT in the last 168 hours.  Liver Function Tests: Recent Labs  Lab 01/27/19 0401 01/29/19 0500 02/01/19 0831  ALBUMIN 2.1* 2.1* 2.1*   No results for input(s): LIPASE, AMYLASE in the last 168 hours. No results for input(s): AMMONIA in the last 168 hours.  CBC: Recent Labs  Lab 01/27/19 0401 01/29/19 0500 01/30/19 0459 02/01/19 0831  WBC 4.3 4.0 5.0 6.5  HGB 7.5* 7.1*  7.6* 7.4*  HCT 22.2* 22.4* 24.1* 22.9*  MCV 89.2 92.6 93.1 90.9  PLT 120* 122* 127* 132*    Cardiac Enzymes: No results for input(s): CKTOTAL, CKMB, CKMBINDEX, TROPONINI in the last 168 hours.  BNP (last 3 results) No results for input(s): BNP in the last 8760 hours.  ProBNP (last 3 results) No results for input(s): PROBNP in the last 8760 hours.  Radiological Exams: No results found.  Assessment/Plan Active Problems:   Acute on chronic respiratory failure with hypoxia (HCC)   Chronic kidney disease, stage IV (severe) (HCC)   Acute lower GI bleeding   Bacterial lobar pneumonia   1. Acute on chronic respiratory failure hypoxia we will continue with weaning on pressure support the goal of 16 hours 2. Chronic kidney disease stage IV continue present management on hemodialysis per nephrology recommendations 3. Acute GI bleed treated no further bleeding noted 4. Bacterial pneumonia treated we will continue to monitor   I have personally seen and evaluated the patient, evaluated laboratory and imaging results, formulated the assessment and plan and placed orders. The Patient requires high complexity decision making for assessment and support.  Case was discussed on Rounds with the Respiratory Therapy Staff  Allyne Gee, MD [facility] Pulmonary Critical Care Medicine Sleep Medicine

## 2019-02-03 DIAGNOSIS — J159 Unspecified bacterial pneumonia: Secondary | ICD-10-CM | POA: Diagnosis not present

## 2019-02-03 DIAGNOSIS — J9621 Acute and chronic respiratory failure with hypoxia: Secondary | ICD-10-CM | POA: Diagnosis not present

## 2019-02-03 DIAGNOSIS — N184 Chronic kidney disease, stage 4 (severe): Secondary | ICD-10-CM | POA: Diagnosis not present

## 2019-02-03 DIAGNOSIS — K922 Gastrointestinal hemorrhage, unspecified: Secondary | ICD-10-CM | POA: Diagnosis not present

## 2019-02-03 LAB — RENAL FUNCTION PANEL
Albumin: 2.2 g/dL — ABNORMAL LOW (ref 3.5–5.0)
Anion gap: 12 (ref 5–15)
BUN: 26 mg/dL — ABNORMAL HIGH (ref 8–23)
CO2: 24 mmol/L (ref 22–32)
Calcium: 9.3 mg/dL (ref 8.9–10.3)
Chloride: 94 mmol/L — ABNORMAL LOW (ref 98–111)
Creatinine, Ser: 2.1 mg/dL — ABNORMAL HIGH (ref 0.44–1.00)
GFR calc Af Amer: 25 mL/min — ABNORMAL LOW (ref >60)
GFR calc non Af Amer: 22 mL/min — ABNORMAL LOW (ref >60)
Glucose, Bld: 90 mg/dL (ref 70–99)
Phosphorus: 1.7 mg/dL — ABNORMAL LOW (ref 2.5–4.6)
Potassium: 3.9 mmol/L (ref 3.5–5.1)
Sodium: 130 mmol/L — ABNORMAL LOW (ref 135–145)

## 2019-02-03 LAB — CBC
HCT: 22.8 % — ABNORMAL LOW (ref 36.0–46.0)
Hemoglobin: 7.3 g/dL — ABNORMAL LOW (ref 12.0–15.0)
MCH: 29.6 pg (ref 26.0–34.0)
MCHC: 32 g/dL (ref 30.0–36.0)
MCV: 92.3 fL (ref 80.0–100.0)
Platelets: 123 10*3/uL — ABNORMAL LOW (ref 150–400)
RBC: 2.47 MIL/uL — ABNORMAL LOW (ref 3.87–5.11)
RDW: 15.8 % — ABNORMAL HIGH (ref 11.5–15.5)
WBC: 4.9 10*3/uL (ref 4.0–10.5)
nRBC: 0 % (ref 0.0–0.2)

## 2019-02-03 LAB — HEPATITIS B SURFACE ANTIGEN: Hepatitis B Surface Ag: NONREACTIVE

## 2019-02-03 NOTE — Progress Notes (Signed)
[facility]  ROUNDING NOTE   Subjective:  Patient seen at bedside. Remains critically ill and still on the ventilator. Seen during dialysis treatment. Appears to be tolerating well.   Objective:  Vital signs in last 24 hours:  Temperature 95.1 pulse 75 respirations 25 blood pressure 118/55   Physical Exam: General: Critically ill-appearing  Head: Normocephalic/atraumatic  Eyes: Anicteric  Neck: Supple, trachea midline  Lungs:  Scattered rhonchi rales, vent assisted  Heart: S1S2 no rubs  Abdomen:  Soft, bowel sounds present, PEG tube noted  Extremities: 2+ peripheral edema.  Neurologic: Awake, follows simple commands  Skin: Scattered ecchymoses  Access: Temporary right internal jugular dialysis catheter    Basic Metabolic Panel: Recent Labs  Lab 01/28/19 0358 01/29/19 0500 02/01/19 0831 02/03/19 0639  NA  --  133* 131* 130*  K  --  3.9 4.1 3.9  CL  --  96* 95* 94*  CO2  --  27 27 24   GLUCOSE  --  71 93 90  BUN  --  23 31* 26*  CREATININE  --  2.10* 2.60* 2.10*  CALCIUM  --  9.0 9.4 9.3  MG 1.7 1.9  --   --   PHOS  --  2.6 1.6* 1.7*    Liver Function Tests: Recent Labs  Lab 01/29/19 0500 02/01/19 0831 02/03/19 0639  ALBUMIN 2.1* 2.1* 2.2*   No results for input(s): LIPASE, AMYLASE in the last 168 hours. No results for input(s): AMMONIA in the last 168 hours.  CBC: Recent Labs  Lab 01/29/19 0500 01/30/19 0459 02/01/19 0831 02/03/19 0639  WBC 4.0 5.0 6.5 4.9  HGB 7.1* 7.6* 7.4* 7.3*  HCT 22.4* 24.1* 22.9* 22.8*  MCV 92.6 93.1 90.9 92.3  PLT 122* 127* 132* 123*    Cardiac Enzymes: No results for input(s): CKTOTAL, CKMB, CKMBINDEX, TROPONINI in the last 168 hours.  BNP: Invalid input(s): POCBNP  CBG: No results for input(s): GLUCAP in the last 168 hours.  Microbiology: Results for orders placed or performed during the hospital encounter of 01/22/19  SARS Coronavirus 2 [facility] order, Performed in [facility] hospital lab)  Nasopharyngeal Nasopharyngeal Swab     Status: None   Collection Time: 01/27/19  2:40 PM   Specimen: Nasopharyngeal Swab  Result Value Ref Range Status   SARS Coronavirus 2 NEGATIVE NEGATIVE Final    Comment: (NOTE) If result is NEGATIVE SARS-CoV-2 target nucleic acids are NOT DETECTED. The SARS-CoV-2 RNA is generally detectable in upper and lower  respiratory specimens during the acute phase of infection. The lowest  concentration of SARS-CoV-2 viral copies this assay can detect is 250  copies / mL. A negative result does not preclude SARS-CoV-2 infection  and should not be used as the sole basis for treatment or other  patient management decisions.  A negative result may occur with  improper specimen collection / handling, submission of specimen other  than nasopharyngeal swab, presence of viral mutation(s) within the  areas targeted by this assay, and inadequate number of viral copies  (<250 copies / mL). A negative result must be combined with clinical  observations, patient history, and epidemiological information. If result is POSITIVE SARS-CoV-2 target nucleic acids are DETECTED. The SARS-CoV-2 RNA is generally detectable in upper and lower  respiratory specimens dur ing the acute phase of infection.  Positive  results are indicative of active infection with SARS-CoV-2.  Clinical  correlation with patient history and other diagnostic information is  necessary to determine patient infection status.  Positive results do  not rule out bacterial infection or co-infection with other viruses. If result is PRESUMPTIVE POSTIVE SARS-CoV-2 nucleic acids MAY BE PRESENT.   A presumptive positive result was obtained on the submitted specimen  and confirmed on repeat testing.  While 2019 novel coronavirus  (SARS-CoV-2) nucleic acids may be present in the submitted sample  additional confirmatory testing may be necessary for epidemiological  and / or clinical management purposes  to  differentiate between  SARS-CoV-2 and other Sarbecovirus currently known to infect humans.  If clinically indicated additional testing with an alternate test  methodology [id number]) is advised. The SARS-CoV-2 RNA is generally  detectable in upper and lower respiratory sp ecimens during the acute  phase of infection. The expected result is Negative. Fact Sheet for Patients:  StrictlyIdeas.no Fact Sheet for Healthcare Providers: BankingDealers.co.za This test is not yet approved or cleared by the [place] FDA and has been authorized for detection and/or diagnosis of SARS-CoV-2 by FDA under an Emergency Use Authorization (EUA).  This EUA will remain in effect (meaning this test can be used) for the duration of the COVID-19 declaration under Section 564(b)(1) of the Act, 21 U.S.C. section 360bbb-3(b)(1), unless the authorization is terminated or revoked sooner. Performed at [facility], [place] [address] [place], [place] [address]   C difficile quick scan w PCR reflex     Status: Abnormal   Collection Time: 02/02/19 10:51 PM   Specimen: STOOL  Result Value Ref Range Status   C Diff antigen POSITIVE (A) NEGATIVE Final    Comment: CRITICAL RESULT CALLED TO, READ BACK BY AND VERIFIED WITH: C.MASEKO,RN AT 2330 BY L.PITT 02/03/2019    C Diff toxin POSITIVE (A) NEGATIVE Final   C Diff interpretation Toxin producing C. difficile detected.  Final    Comment: Performed at [facility], [place] [address] [place], [place] [address]    Coagulation Studies: No results for input(s): LABPROT, INR in the last 72 hours.  Urinalysis: No results for input(s): COLORURINE, LABSPEC, PHURINE, GLUCOSEU, HGBUR, BILIRUBINUR, KETONESUR, PROTEINUR, UROBILINOGEN, NITRITE, LEUKOCYTESUR in the last 72 hours.  Invalid input(s): APPERANCEUR    Imaging: No results found.   Medications:       Assessment/ Plan:  78 y.o. female with a PMHx of  congestive heart failure, rheumatoid arthritis, chronic kidney disease stage IV, hyperlipidemia, fibromyalgia, giant cell arteritis, hypertension, thoracic ascending aortic aneurysm, duodenal ulcers, severe diverticulosis, coronary artery disease, hypothyroidism, who was admitted to [facility] on 01/01/2019 for ongoing treatment of acute respiratory failure, generalized debility, and severe renal dysfunction.   1.  Acute renal failure/chronic kidney disease stage IV.  Patient seen during dialysis treatment.  Ultrafiltration target 2 kg today.  Next dialysis on Friday.  2.  Acute respiratory failure.  Patient maintained on the ventilator currently.  Weaning as per pulmonary/critical care.  3.  Anemia chronic kidney disease.  Hemoglobin down slightly to 7.3.  Consider blood transfusion for hemoglobin of 7 or less.    LOS: 0 Stephanie Stark 10/14/20208:58 AM

## 2019-02-03 NOTE — Progress Notes (Signed)
Pulmonary Critical Care Medicine [place]   PULMONARY CRITICAL CARE SERVICE  PROGRESS NOTE  Date of Service: 02/03/2019  Stephanie Stark  [id number]  DOB: 03/18/1941   DOA: 01/22/2019  Referring Physician: Merton Border, MD  HPI: Stephanie Stark is a 78 y.o. female seen for follow up of Acute on Chronic Respiratory Failure.  Patient currently is on full support and assist control mode patient was attempted on pressure support but did not tolerate  Medications: Reviewed on Rounds  Physical Exam:  Vitals: Temperature 95.1 pulse 75 respiratory 25 blood pressure 118/55 saturations 100%  Ventilator Settings currently on assist control FiO2 28% with a tidal volume of 400 and PEEP of 5  . General: Comfortable at this time . Eyes: Grossly normal lids, irises & conjunctiva . ENT: grossly tongue is normal . Neck: no obvious mass . Cardiovascular: S1 S2 normal no gallop . Respiratory: No rhonchi coarse breath sounds . Abdomen: soft . Skin: no rash seen on limited exam . Musculoskeletal: not rigid . Psychiatric:unable to assess . Neurologic: no seizure no involuntary movements         Lab Data:   Basic Metabolic Panel: Recent Labs  Lab 01/28/19 0358 01/29/19 0500 02/01/19 0831 02/03/19 0639  NA  --  133* 131* 130*  K  --  3.9 4.1 3.9  CL  --  96* 95* 94*  CO2  --  27 27 24   GLUCOSE  --  71 93 90  BUN  --  23 31* 26*  CREATININE  --  2.10* 2.60* 2.10*  CALCIUM  --  9.0 9.4 9.3  MG 1.7 1.9  --   --   PHOS  --  2.6 1.6* 1.7*    ABG: No results for input(s): PHART, PCO2ART, PO2ART, HCO3, O2SAT in the last 168 hours.  Liver Function Tests: Recent Labs  Lab 01/29/19 0500 02/01/19 0831 02/03/19 0639  ALBUMIN 2.1* 2.1* 2.2*   No results for input(s): LIPASE, AMYLASE in the last 168 hours. No results for input(s): AMMONIA in the last 168 hours.  CBC: Recent Labs  Lab 01/29/19 0500 01/30/19 0459 02/01/19 0831 02/03/19 0639  WBC 4.0 5.0 6.5  4.9  HGB 7.1* 7.6* 7.4* 7.3*  HCT 22.4* 24.1* 22.9* 22.8*  MCV 92.6 93.1 90.9 92.3  PLT 122* 127* 132* 123*    Cardiac Enzymes: No results for input(s): CKTOTAL, CKMB, CKMBINDEX, TROPONINI in the last 168 hours.  BNP (last 3 results) No results for input(s): BNP in the last 8760 hours.  ProBNP (last 3 results) No results for input(s): PROBNP in the last 8760 hours.  Radiological Exams: No results found.  Assessment/Plan Active Problems:   Acute on chronic respiratory failure with hypoxia (HCC)   Chronic kidney disease, stage IV (severe) (HCC)   Acute lower GI bleeding   Bacterial lobar pneumonia   1. Acute on chronic respiratory failure with hypoxia patient currently is on full support on assist control mode not tolerating weaning at this time 2. Chronic kidney disease stage IV followed by nephrology for hemodialysis 3. Acute GI bleed no active bleeding 4. Bacterial pneumonia treated we will continue to follow   I have personally seen and evaluated the patient, evaluated laboratory and imaging results, formulated the assessment and plan and placed orders. The Patient requires high complexity decision making for assessment and support.  Case was discussed on Rounds with the Respiratory Therapy Staff  Allyne Gee, MD [facility] Pulmonary Critical Care Medicine Sleep Medicine

## 2019-02-04 DIAGNOSIS — N184 Chronic kidney disease, stage 4 (severe): Secondary | ICD-10-CM | POA: Diagnosis not present

## 2019-02-04 DIAGNOSIS — K922 Gastrointestinal hemorrhage, unspecified: Secondary | ICD-10-CM | POA: Diagnosis not present

## 2019-02-04 DIAGNOSIS — J9621 Acute and chronic respiratory failure with hypoxia: Secondary | ICD-10-CM | POA: Diagnosis not present

## 2019-02-04 DIAGNOSIS — J159 Unspecified bacterial pneumonia: Secondary | ICD-10-CM | POA: Diagnosis not present

## 2019-02-04 LAB — CBC
HCT: 21.1 % — ABNORMAL LOW (ref 36.0–46.0)
Hemoglobin: 6.8 g/dL — CL (ref 12.0–15.0)
MCH: 29.6 pg (ref 26.0–34.0)
MCHC: 32.2 g/dL (ref 30.0–36.0)
MCV: 91.7 fL (ref 80.0–100.0)
Platelets: 133 10*3/uL — ABNORMAL LOW (ref 150–400)
RBC: 2.3 MIL/uL — ABNORMAL LOW (ref 3.87–5.11)
RDW: 16.3 % — ABNORMAL HIGH (ref 11.5–15.5)
WBC: 6.8 10*3/uL (ref 4.0–10.5)
nRBC: 0 % (ref 0.0–0.2)

## 2019-02-04 LAB — RENAL FUNCTION PANEL
Albumin: 2.3 g/dL — ABNORMAL LOW (ref 3.5–5.0)
Anion gap: 7 (ref 5–15)
BUN: 15 mg/dL (ref 8–23)
CO2: 29 mmol/L (ref 22–32)
Calcium: 8.8 mg/dL — ABNORMAL LOW (ref 8.9–10.3)
Chloride: 100 mmol/L (ref 98–111)
Creatinine, Ser: 1.5 mg/dL — ABNORMAL HIGH (ref 0.44–1.00)
GFR calc Af Amer: 38 mL/min — ABNORMAL LOW (ref >60)
GFR calc non Af Amer: 33 mL/min — ABNORMAL LOW (ref >60)
Glucose, Bld: 91 mg/dL (ref 70–99)
Phosphorus: 2 mg/dL — ABNORMAL LOW (ref 2.5–4.6)
Potassium: 3.5 mmol/L (ref 3.5–5.1)
Sodium: 136 mmol/L (ref 135–145)

## 2019-02-04 LAB — PREPARE RBC (CROSSMATCH)

## 2019-02-04 LAB — MAGNESIUM: Magnesium: 1.9 mg/dL (ref 1.7–2.4)

## 2019-02-04 NOTE — Progress Notes (Signed)
Pulmonary Critical Care Medicine [place]   PULMONARY CRITICAL CARE SERVICE  PROGRESS NOTE  Date of Service: 02/04/2019  [name]  [name]  DOB: 08/10/1940   DOA: 01/22/2019  Referring Physician: Merton Border, MD  HPI: Stephanie Stark is a 78 y.o. female seen for follow up of Acute on Chronic Respiratory Failure.  Patient currently is on assist control mode full support on the ventilator  Medications: Reviewed on Rounds  Physical Exam:  Vitals: Temperature 98.1 pulse 81 respiratory rate 30 blood pressure 1 3458 saturations 98%  Ventilator Settings patient is on the ventilator assist control mode currently FiO2 28% tidal volume 400 PEEP 5  . General: Comfortable at this time . Eyes: Grossly normal lids, irises & conjunctiva . ENT: grossly tongue is normal . Neck: no obvious mass . Cardiovascular: S1 S2 normal no gallop . Respiratory: No rhonchi coarse breath sounds are noted . Abdomen: soft . Skin: no rash seen on limited exam . Musculoskeletal: not rigid . Psychiatric:unable to assess . Neurologic: no seizure no involuntary movements         Lab Data:   Basic Metabolic Panel: Recent Labs  Lab 01/29/19 0500 02/01/19 0831 02/03/19 0639 02/04/19 0847  NA 133* 131* 130* 136  K 3.9 4.1 3.9 3.5  CL 96* 95* 94* 100  CO2 27 27 24 29   GLUCOSE 71 93 90 91  BUN 23 31* 26* 15  CREATININE 2.10* 2.60* 2.10* 1.50*  CALCIUM 9.0 9.4 9.3 8.8*  MG 1.9  --   --  1.9  PHOS 2.6 1.6* 1.7* 2.0*    ABG: No results for input(s): PHART, PCO2ART, PO2ART, HCO3, O2SAT in the last 168 hours.  Liver Function Tests: Recent Labs  Lab 01/29/19 0500 02/01/19 0831 02/03/19 0639 02/04/19 0847  ALBUMIN 2.1* 2.1* 2.2* 2.3*   No results for input(s): LIPASE, AMYLASE in the last 168 hours. No results for input(s): AMMONIA in the last 168 hours.  CBC: Recent Labs  Lab 01/29/19 0500 01/30/19 0459 02/01/19 0831 02/03/19 0639 02/04/19 0847  WBC 4.0  5.0 6.5 4.9 6.8  HGB 7.1* 7.6* 7.4* 7.3* 6.8*  HCT 22.4* 24.1* 22.9* 22.8* 21.1*  MCV 92.6 93.1 90.9 92.3 91.7  PLT 122* 127* 132* 123* 133*    Cardiac Enzymes: No results for input(s): CKTOTAL, CKMB, CKMBINDEX, TROPONINI in the last 168 hours.  BNP (last 3 results) No results for input(s): BNP in the last 8760 hours.  ProBNP (last 3 results) No results for input(s): PROBNP in the last 8760 hours.  Radiological Exams: No results found.  Assessment/Plan Active Problems:   Acute on chronic respiratory failure with hypoxia (HCC)   Chronic kidney disease, stage IV (severe) (HCC)   Acute lower GI bleeding   Bacterial lobar pneumonia   1. Acute on chronic respiratory failure with hypoxia plan is to continue with the patient's current vent settings.  Check the RSB I wean as tolerated.  Yesterday patient did not tolerate the pressure support wean today patient did not tolerate pressure support. 2. Chronic kidney disease stage IV we will continue with supportive care nephrology recommendations 3. Acute GI bleed no active bleeding 4. Bacterial pneumonia treated we will continue to monitor   I have personally seen and evaluated the patient, evaluated laboratory and imaging results, formulated the assessment and plan and placed orders. The Patient requires high complexity decision making for assessment and support.  Case was discussed on Rounds with the Respiratory Therapy Staff  Allyne Gee,  MD [facility] Pulmonary Critical Care Medicine Sleep Medicine

## 2019-02-05 DIAGNOSIS — J159 Unspecified bacterial pneumonia: Secondary | ICD-10-CM | POA: Diagnosis not present

## 2019-02-05 DIAGNOSIS — J9621 Acute and chronic respiratory failure with hypoxia: Secondary | ICD-10-CM | POA: Diagnosis not present

## 2019-02-05 DIAGNOSIS — K922 Gastrointestinal hemorrhage, unspecified: Secondary | ICD-10-CM | POA: Diagnosis not present

## 2019-02-05 DIAGNOSIS — N184 Chronic kidney disease, stage 4 (severe): Secondary | ICD-10-CM | POA: Diagnosis not present

## 2019-02-05 LAB — RENAL FUNCTION PANEL
Albumin: 2.1 g/dL — ABNORMAL LOW (ref 3.5–5.0)
Anion gap: 8 (ref 5–15)
BUN: 25 mg/dL — ABNORMAL HIGH (ref 8–23)
CO2: 26 mmol/L (ref 22–32)
Calcium: 8.8 mg/dL — ABNORMAL LOW (ref 8.9–10.3)
Chloride: 98 mmol/L (ref 98–111)
Creatinine, Ser: 2.02 mg/dL — ABNORMAL HIGH (ref 0.44–1.00)
GFR calc Af Amer: 27 mL/min — ABNORMAL LOW (ref >60)
GFR calc non Af Amer: 23 mL/min — ABNORMAL LOW (ref >60)
Glucose, Bld: 106 mg/dL — ABNORMAL HIGH (ref 70–99)
Phosphorus: 2 mg/dL — ABNORMAL LOW (ref 2.5–4.6)
Potassium: 3.5 mmol/L (ref 3.5–5.1)
Sodium: 132 mmol/L — ABNORMAL LOW (ref 135–145)

## 2019-02-05 LAB — CBC
HCT: 24.9 % — ABNORMAL LOW (ref 36.0–46.0)
Hemoglobin: 8.1 g/dL — ABNORMAL LOW (ref 12.0–15.0)
MCH: 29.8 pg (ref 26.0–34.0)
MCHC: 32.5 g/dL (ref 30.0–36.0)
MCV: 91.5 fL (ref 80.0–100.0)
Platelets: 120 10*3/uL — ABNORMAL LOW (ref 150–400)
RBC: 2.72 MIL/uL — ABNORMAL LOW (ref 3.87–5.11)
RDW: 16.1 % — ABNORMAL HIGH (ref 11.5–15.5)
WBC: 8 10*3/uL (ref 4.0–10.5)
nRBC: 0 % (ref 0.0–0.2)

## 2019-02-05 NOTE — Progress Notes (Addendum)
Pulmonary Critical Care Medicine [place]   PULMONARY CRITICAL CARE SERVICE  PROGRESS NOTE  Date of Service: 02/05/2019  Stephanie Stark  [name]  DOB: [birth year]   DOA: 01/22/2019  Referring Physician: Merton Border, MD  HPI: Stephanie Stark is a 78 y.o. female seen for follow up of Acute on Chronic Respiratory Failure.  Patient remains on full spinal ventilator at this time has 16-hour goal to support however failed BiPAP support.  Medications: Reviewed on Rounds  Physical Exam:  Vitals: Pulse 87 respirations 20 BP 141/56 O2 sat 100% temp 96.3  Ventilator Settings ventilator mode AC VC rate of 18 tidal volume 400-5 and FiO2 28%  . General: Comfortable at this time . Eyes: Grossly normal lids, irises & conjunctiva . ENT: grossly tongue is normal . Neck: no obvious mass . Cardiovascular: S1 S2 normal no gallop . Respiratory: No rales or rhonchi noted . Abdomen: soft . Skin: no rash seen on limited exam . Musculoskeletal: not rigid . Psychiatric:unable to assess . Neurologic: no seizure no involuntary movements         Lab Data:   Basic Metabolic Panel: Recent Labs  Lab 02/01/19 0831 02/03/19 0639 02/04/19 0847 02/05/19 0619  NA 131* 130* 136 132*  K 4.1 3.9 3.5 3.5  CL 95* 94* 100 98  CO2 27 24 29 26   GLUCOSE 93 90 91 106*  BUN 31* 26* 15 25*  CREATININE 2.60* 2.10* 1.50* 2.02*  CALCIUM 9.4 9.3 8.8* 8.8*  MG  --   --  1.9  --   PHOS 1.6* 1.7* 2.0* 2.0*    ABG: No results for input(s): PHART, PCO2ART, PO2ART, HCO3, O2SAT in the last 168 hours.  Liver Function Tests: Recent Labs  Lab 02/01/19 0831 02/03/19 0639 02/04/19 0847 02/05/19 0619  ALBUMIN 2.1* 2.2* 2.3* 2.1*   No results for input(s): LIPASE, AMYLASE in the last 168 hours. No results for input(s): AMMONIA in the last 168 hours.  CBC: Recent Labs  Lab 01/30/19 0459 02/01/19 0831 02/03/19 0639 02/04/19 0847 02/05/19 0619  WBC 5.0 6.5 4.9 6.8 8.0  HGB 7.6*  7.4* 7.3* 6.8* 8.1*  HCT 24.1* 22.9* 22.8* 21.1* 24.9*  MCV 93.1 90.9 92.3 91.7 91.5  PLT 127* 132* 123* 133* 120*    Cardiac Enzymes: No results for input(s): CKTOTAL, CKMB, CKMBINDEX, TROPONINI in the last 168 hours.  BNP (last 3 results) No results for input(s): BNP in the last 8760 hours.  ProBNP (last 3 results) No results for input(s): PROBNP in the last 8760 hours.  Radiological Exams: No results found.  Assessment/Plan Active Problems:   Acute on chronic respiratory failure with hypoxia (HCC)   Chronic kidney disease, stage IV (severe) (HCC)   Acute lower GI bleeding   Bacterial lobar pneumonia   1. Acute on chronic respiratory failure with hypoxia plan is to continue with the patient's current vent settings.    Unable to tolerate wean to pressure support and will continue to attempt per protocol.  Continue aggressive pulmonary toilet and supportive measures. 2. Chronic kidney disease stage IV we will continue with supportive care nephrology recommendations 3. Acute GI bleed no active bleeding 4. Bacterial pneumonia treated we will continue to monitor   I have personally seen and evaluated the patient, evaluated laboratory and imaging results, formulated the assessment and plan and placed orders. The Patient requires high complexity decision making for assessment and support.  Case was discussed on Rounds with the Respiratory Therapy Staff  Dareon Nunziato A  Humphrey Rolls, MD [facility] Pulmonary Critical Care Medicine Sleep Medicine

## 2019-02-05 NOTE — Progress Notes (Signed)
[facility]  ROUNDING NOTE   Subjective:  Patient seen and evaluated at bedside. Tolerating dialysis treatment well at the moment. Ultrafiltration target 2.1 kg.   Objective:  Vital signs in last 24 hours:  Temperature 96.3 pulse 87 respirations 20 blood pressure 141/56   Physical Exam: General: Critically ill-appearing  Head: Normocephalic/atraumatic  Eyes: Anicteric  Neck: Supple, trachea midline  Lungs:  Scattered rhonchi rales, vent assisted  Heart: S1S2 no rubs  Abdomen:  Soft, bowel sounds present, PEG tube noted  Extremities: 2+ peripheral edema.  Neurologic: Awake, follows simple commands  Skin: Scattered ecchymoses  Access: Temporary right internal jugular dialysis catheter    Basic Metabolic Panel: Recent Labs  Lab 02/01/19 0831 02/03/19 0639 02/04/19 0847 02/05/19 0619  NA 131* 130* 136 132*  K 4.1 3.9 3.5 3.5  CL 95* 94* 100 98  CO2 27 24 29 26   GLUCOSE 93 90 91 106*  BUN 31* 26* 15 25*  CREATININE 2.60* 2.10* 1.50* 2.02*  CALCIUM 9.4 9.3 8.8* 8.8*  MG  --   --  1.9  --   PHOS 1.6* 1.7* 2.0* 2.0*    Liver Function Tests: Recent Labs  Lab 02/01/19 0831 02/03/19 0639 02/04/19 0847 02/05/19 0619  ALBUMIN 2.1* 2.2* 2.3* 2.1*   No results for input(s): LIPASE, AMYLASE in the last 168 hours. No results for input(s): AMMONIA in the last 168 hours.  CBC: Recent Labs  Lab 01/30/19 0459 02/01/19 0831 02/03/19 0639 02/04/19 0847 02/05/19 0619  WBC 5.0 6.5 4.9 6.8 8.0  HGB 7.6* 7.4* 7.3* 6.8* 8.1*  HCT 24.1* 22.9* 22.8* 21.1* 24.9*  MCV 93.1 90.9 92.3 91.7 91.5  PLT 127* 132* 123* 133* 120*    Cardiac Enzymes: No results for input(s): CKTOTAL, CKMB, CKMBINDEX, TROPONINI in the last 168 hours.  BNP: Invalid input(s): POCBNP  CBG: No results for input(s): GLUCAP in the last 168 hours.  Microbiology: Results for orders placed or performed during the hospital encounter of 01/22/19  SARS Coronavirus 2 [facility] order,  Performed in [facility] hospital lab) Nasopharyngeal Nasopharyngeal Swab     Status: None   Collection Time: 01/27/19  2:40 PM   Specimen: Nasopharyngeal Swab  Result Value Ref Range Status   SARS Coronavirus 2 NEGATIVE NEGATIVE Final    Comment: (NOTE) If result is NEGATIVE SARS-CoV-2 target nucleic acids are NOT DETECTED. The SARS-CoV-2 RNA is generally detectable in upper and lower  respiratory specimens during the acute phase of infection. The lowest  concentration of SARS-CoV-2 viral copies this assay can detect is 250  copies / mL. A negative result does not preclude SARS-CoV-2 infection  and should not be used as the sole basis for treatment or other  patient management decisions.  A negative result may occur with  improper specimen collection / handling, submission of specimen other  than nasopharyngeal swab, presence of viral mutation(s) within the  areas targeted by this assay, and inadequate number of viral copies  (<250 copies / mL). A negative result must be combined with clinical  observations, patient history, and epidemiological information. If result is POSITIVE SARS-CoV-2 target nucleic acids are DETECTED. The SARS-CoV-2 RNA is generally detectable in upper and lower  respiratory specimens dur ing the acute phase of infection.  Positive  results are indicative of active infection with SARS-CoV-2.  Clinical  correlation with patient history and other diagnostic information is  necessary to determine patient infection status.  Positive results do  not rule out bacterial infection or  co-infection with other viruses. If result is PRESUMPTIVE POSTIVE SARS-CoV-2 nucleic acids MAY BE PRESENT.   A presumptive positive result was obtained on the submitted specimen  and confirmed on repeat testing.  While 2019 novel coronavirus  (SARS-CoV-2) nucleic acids may be present in the submitted sample  additional confirmatory testing may be necessary for epidemiological  and / or  clinical management purposes  to differentiate between  SARS-CoV-2 and other Sarbecovirus currently known to infect humans.  If clinically indicated additional testing with an alternate test  methodology [phone]) is advised. The SARS-CoV-2 RNA is generally  detectable in upper and lower respiratory sp ecimens during the acute  phase of infection. The expected result is Negative. Fact Sheet for Patients:  StrictlyIdeas.no Fact Sheet for Healthcare Providers: BankingDealers.co.za This test is not yet approved or cleared by the [place] FDA and has been authorized for detection and/or diagnosis of SARS-CoV-2 by FDA under an Emergency Use Authorization (EUA).  This EUA will remain in effect (meaning this test can be used) for the duration of the COVID-19 declaration under Section 564(b)(1) of the Act, 21 U.S.C. section 360bbb-3(b)(1), unless the authorization is terminated or revoked sooner. Performed at [facility], [place] [address], [place], [place] [address]   C difficile quick scan w PCR reflex     Status: Abnormal   Collection Time: 02/02/19 10:51 PM   Specimen: STOOL  Result Value Ref Range Status   C Diff antigen POSITIVE (A) NEGATIVE Final    Comment: CRITICAL RESULT CALLED TO, READ BACK BY AND VERIFIED WITH: C.MASEKO,RN AT 2330 BY L.PITT 02/03/2019    C Diff toxin POSITIVE (A) NEGATIVE Final   C Diff interpretation Toxin producing C. difficile detected.  Final    Comment: Performed at [facility], [place] [address], [place], [place] [address]    Coagulation Studies: No results for input(s): LABPROT, INR in the last 72 hours.  Urinalysis: No results for input(s): COLORURINE, LABSPEC, PHURINE, GLUCOSEU, HGBUR, BILIRUBINUR, KETONESUR, PROTEINUR, UROBILINOGEN, NITRITE, LEUKOCYTESUR in the last 72 hours.  Invalid input(s): APPERANCEUR    Imaging: No results found.   Medications:       Assessment/ Plan:   78 y.o. female with a PMHx of congestive heart failure, rheumatoid arthritis, chronic kidney disease stage IV, hyperlipidemia, fibromyalgia, giant cell arteritis, hypertension, thoracic ascending aortic aneurysm, duodenal ulcers, severe diverticulosis, coronary artery disease, hypothyroidism, who was admitted to [facility] on 01/01/2019 for ongoing treatment of acute respiratory failure, generalized debility, and severe renal dysfunction.   1.  Acute renal failure/chronic kidney disease stage IV.  Patient seen and evaluated during dialysis.  Ultrafiltration target 2.1 kg.  We plan to complete abscess treatment today and next treatment will be on Monday.  2.  Acute respiratory failure.  FiO2 on the ventilator is remained stable at 28%.  Continue current ventilatory support.  3.  Anemia chronic kidney disease.  Hemoglobin is now up to 8.1 post blood transfusion.  Continue to monitor CBC.    LOS: 0 Stephanie Stark 10/16/20208:04 AM

## 2019-02-06 DIAGNOSIS — N184 Chronic kidney disease, stage 4 (severe): Secondary | ICD-10-CM | POA: Diagnosis not present

## 2019-02-06 DIAGNOSIS — J159 Unspecified bacterial pneumonia: Secondary | ICD-10-CM | POA: Diagnosis not present

## 2019-02-06 DIAGNOSIS — K922 Gastrointestinal hemorrhage, unspecified: Secondary | ICD-10-CM | POA: Diagnosis not present

## 2019-02-06 DIAGNOSIS — J9621 Acute and chronic respiratory failure with hypoxia: Secondary | ICD-10-CM | POA: Diagnosis not present

## 2019-02-06 NOTE — Progress Notes (Addendum)
Pulmonary Critical Care Medicine [place]   PULMONARY CRITICAL CARE SERVICE  PROGRESS NOTE  Date of Service: 02/06/2019  Jaisa Mille  [name]  DOB: 10-12-1940   DOA: 01/22/2019  Referring Physician: Merton Border, MD  HPI: Merline Castrejon is a 78 y.o. female seen for follow up of Acute on Chronic Respiratory Failure.  Patient is on pressure support today 20/5 FiO2 20% for 16-hour goal.  Medications: Reviewed on Rounds  Physical Exam:  Vitals: Pulse 76 respirations 23 BP 122/52 O2 sat 100% temp 96.6  Ventilator Settings pressure support 20/5 FiO2 28%  . General: Comfortable at this time . Eyes: Grossly normal lids, irises & conjunctiva . ENT: grossly tongue is normal . Neck: no obvious mass . Cardiovascular: S1 S2 normal no gallop . Respiratory: No rales or rhonchi added . Abdomen: soft . Skin: no rash seen on limited exam . Musculoskeletal: not rigid . Psychiatric:unable to assess . Neurologic: no seizure no involuntary movements         Lab Data:   Basic Metabolic Panel: Recent Labs  Lab 02/01/19 0831 02/03/19 0639 02/04/19 0847 02/05/19 0619  NA 131* 130* 136 132*  K 4.1 3.9 3.5 3.5  CL 95* 94* 100 98  CO2 27 24 29 26   GLUCOSE 93 90 91 106*  BUN 31* 26* 15 25*  CREATININE 2.60* 2.10* 1.50* 2.02*  CALCIUM 9.4 9.3 8.8* 8.8*  MG  --   --  1.9  --   PHOS 1.6* 1.7* 2.0* 2.0*    ABG: No results for input(s): PHART, PCO2ART, PO2ART, HCO3, O2SAT in the last 168 hours.  Liver Function Tests: Recent Labs  Lab 02/01/19 0831 02/03/19 0639 02/04/19 0847 02/05/19 0619  ALBUMIN 2.1* 2.2* 2.3* 2.1*   No results for input(s): LIPASE, AMYLASE in the last 168 hours. No results for input(s): AMMONIA in the last 168 hours.  CBC: Recent Labs  Lab 02/01/19 0831 02/03/19 0639 02/04/19 0847 02/05/19 0619  WBC 6.5 4.9 6.8 8.0  HGB 7.4* 7.3* 6.8* 8.1*  HCT 22.9* 22.8* 21.1* 24.9*  MCV 90.9 92.3 91.7 91.5  PLT 132* 123* 133* 120*     Cardiac Enzymes: No results for input(s): CKTOTAL, CKMB, CKMBINDEX, TROPONINI in the last 168 hours.  BNP (last 3 results) No results for input(s): BNP in the last 8760 hours.  ProBNP (last 3 results) No results for input(s): PROBNP in the last 8760 hours.  Radiological Exams: No results found.  Assessment/Plan Active Problems:   Acute on chronic respiratory failure with hypoxia (HCC)   Chronic kidney disease, stage IV (severe) (HCC)   Acute lower GI bleeding   Bacterial lobar pneumonia   1. Acute on chronic respiratory failure with hypoxia patient will continue weaning per protocol on pressure support at this time.  Has a 16-hour goal today.  Continue supportive measures and pulmonary toilet. 2. Chronic kidney disease stage IV we will continue with supportive care nephrology recommendations 3. Acute GI bleed no active bleeding 4. Bacterial pneumonia treated we will continue to monitor   I have personally seen and evaluated the patient, evaluated laboratory and imaging results, formulated the assessment and plan and placed orders. The Patient requires high complexity decision making for assessment and support.  Case was discussed on Rounds with the Respiratory Therapy Staff  Allyne Gee, MD [facility] Pulmonary Critical Care Medicine Sleep Medicine

## 2019-02-07 DIAGNOSIS — J9621 Acute and chronic respiratory failure with hypoxia: Secondary | ICD-10-CM | POA: Diagnosis not present

## 2019-02-07 DIAGNOSIS — N184 Chronic kidney disease, stage 4 (severe): Secondary | ICD-10-CM | POA: Diagnosis not present

## 2019-02-07 DIAGNOSIS — K922 Gastrointestinal hemorrhage, unspecified: Secondary | ICD-10-CM | POA: Diagnosis not present

## 2019-02-07 DIAGNOSIS — J159 Unspecified bacterial pneumonia: Secondary | ICD-10-CM | POA: Diagnosis not present

## 2019-02-07 LAB — C DIFFICILE QUICK SCREEN W PCR REFLEX
C Diff antigen: POSITIVE — AB
C Diff interpretation: DETECTED
C Diff toxin: POSITIVE — AB

## 2019-02-07 NOTE — Consult Note (Signed)
Chief Complaint: Patient was seen in consultation today for conversion of temporary HD catheter to tunneled HD catheter  Referring Physician(s): Janora Norlander  Supervising Physician: Markus Daft  Patient Status: [facility] inpt  History of Present Illness: Stephanie Stark is a 78 y.o. female with a past medical history significant for RA, fibromyalgia, giant cell arteritis, HLD, HTN, CHF, thoracic AAA, severe diverticulosis with recent GI bleed requiring multiple blood transfusions, duodenal ulcers, CKD IV with recent AKI currently receiving HD and acute respiratory failure requiring intubation and subsequent tracheostomy placement on 01/12/19 who was transferred to [facility] on 01/01/19 for further care. She was previously seen in IR on 01/28/19 for gastrostomy placement and 01/18/19 for temporary HD catheter placement - request has been made to IR for conversion of the existing temporary HD catheter to a tunneled HD catheter for long term use.  Patient sleeping on my exam, she arouses to painful stimuli but is otherwise not responsive which RN notes is her baseline. RN states she is not currently on any blood thinning medications because she had a GI bleed previously and also was recently sent to the ER from [facility] due to bleeding from her tracheostomy which required cauterization in the OR. She is still receiving HD on MWF. No family present during my exam.   Past Medical History:  Diagnosis Date   Acute lower GI bleeding    Acute on chronic respiratory failure with hypoxia (HCC)    Bacterial lobar pneumonia    Chronic kidney disease, stage IV (severe) ([place])     Past Surgical History:  Procedure Laterality Date   IR FLUORO GUIDE CV LINE LEFT  01/04/2019   IR FLUORO GUIDE CV LINE RIGHT  01/18/2019   IR GASTROSTOMY TUBE MOD SED  01/28/2019   IR US GUIDE VASC ACCESS LEFT  01/04/2019   IR US GUIDE VASC ACCESS RIGHT  01/18/2019   TRACHEOSTOMY REVISION N/A 01/22/2019   Procedure: TRACHEOSTOMY  REVISION, CONTROL OF BLEEDING;  Surgeon: Leta Baptist, MD;  Location: [facility] OR;  Service: ENT;  Laterality: N/A;   TRACHEOSTOMY TUBE PLACEMENT N/A 01/12/2019   Procedure: TRACHEOSTOMY;  Surgeon: Rozetta Nunnery, MD;  Location: [facility];  Service: ENT;  Laterality: N/A;    Allergies: Patient has no allergy information on record.  Medications: Prior to Admission medications   Not on File     No family history on file.  Social History   Socioeconomic History   Marital status: Divorced    Spouse name: Not on file   Number of children: Not on file   Years of education: Not on file   Highest education level: Not on file  Occupational History   Not on file  Social Needs   Financial resource strain: Not on file   Food insecurity    Worry: Not on file    Inability: Not on file   Transportation needs    Medical: Not on file    Non-medical: Not on file  Tobacco Use   Smoking status: Unknown If Ever Smoked  Substance and Sexual Activity   Alcohol use: Not Currently   Drug use: Not Currently   Sexual activity: Not on file  Lifestyle   Physical activity    Days per week: Not on file    Minutes per session: Not on file   Stress: Not on file  Relationships   Social connections    Talks on phone: Not on file    Gets together: Not on file  Attends religious service: Not on file    Active member of club or organization: Not on file    Attends meetings of clubs or organizations: Not on file    Relationship status: Not on file  Other Topics Concern   Not on file  Social History Narrative   Not on file     Review of Systems: A 12 point ROS discussed and pertinent positives are indicated in the HPI above.  All other systems are negative.  Review of Systems  Unable to perform ROS: Patient unresponsive    Vital Signs: BP (!) 112/41 (BP Location: Left Arm)    Pulse 79    Resp 17    SpO2 100%   Physical Exam Vitals signs reviewed.  Constitutional:       General: She is not in acute distress.    Appearance: She is ill-appearing.  HENT:     Head: Normocephalic.  Cardiovascular:     Rate and Rhythm: Normal rate.     Comments: (+) right IJ temporary HD catheter in place - dressed appropriately, dried blood around insertion site but no active bleeding noted.  Pulmonary:     Comments: (+) tracheostomy Abdominal:     General: There is no distension.     Palpations: Abdomen is soft.     Tenderness: There is abdominal tenderness (around g-tube insertion site - dried blood around insertion site; bumper pulled back ~3 inches from skin which was cinched to skin on my exam today).  Skin:    General: Skin is warm and dry.  Neurological:     Mental Status: Mental status is at baseline.      MD Evaluation Airway: Other (comments)(Trach) Heart: WNL Abdomen: WNL(G-tube) Chest/ Lungs: WNL ASA  Classification: 3 Mallampati/Airway Score: Lurline Idol)   Imaging: Ct Abdomen Pelvis Wo Contrast  Result Date: 01/16/2019 CLINICAL DATA:  Evaluate for appendicitis.  Diffuse abdominal pain EXAM: CT ABDOMEN AND PELVIS WITHOUT CONTRAST TECHNIQUE: Multidetector CT imaging of the abdomen and pelvis was performed following the standard protocol without IV contrast. COMPARISON:  None FINDINGS: Lower chest: Large right pleural effusion and moderate left effusion identified. Patchy airspace densities are identified within the imaged portions of both lungs. Hepatobiliary: No focal liver abnormality is seen. Status post cholecystectomy. No biliary dilatation. Pancreas: Unremarkable. No pancreatic ductal dilatation or surrounding inflammatory changes. Spleen: Normal in size without focal abnormality. Adrenals/Urinary Tract: Adrenal glands are unremarkable. Kidneys are normal, without renal calculi, focal lesion, or hydronephrosis. Bladder is unremarkable. Foley catheter in situ. Stomach/Bowel: Marked diffuse gastric wall edema is identified. Feeding tube tip is in the gastric  antrum. Several endo clips are identified within the descending duodenum. There is of foci of high attenuation material along the course of the descending duodenum which may reflect enteric contrast material within a duodenal diverticulum. No abnormal distension of the small or large bowel loops. The appendix is visualized and appears normal. Scattered colonic diverticula noted. No colonic wall thickening or inflammation. Vascular/Lymphatic: Aortic atherosclerosis. No aneurysm. No abdominopelvic adenopathy. Reproductive: Status post hysterectomy. No adnexal masses. Other: Marked diffuse body wall edema. Trace ascites identified within the abdomen and pelvis. No focal fluid collections. No free intraperitoneal air. Musculoskeletal: Previous open fixation of right hip fracture. 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 IMPRESSION: 1. No evidence for acute appendicitis. 2. Diffuse edema throughout the wall of the stomach. Feeding tube is identified within the gastric antrum. 3. Marked diffuse body wall edema, bilateral  pleural effusions and trace ascites. 4.  Aortic Atherosclerosis (ICD10-I70.0). Electronically Signed   By: Kerby Moors M.D.   On: 01/16/2019 18:02   Dg Abd 1 View  Result Date: 01/10/2019 CLINICAL DATA:  NG tube placement EXAM: ABDOMEN - 1 VIEW COMPARISON:  01/02/2019 FINDINGS: Orogastric tube with the tip projecting over the stomach. There is no bowel dilatation to suggest obstruction. There is no evidence of pneumoperitoneum, portal venous gas or pneumatosis. There are no pathologic calcifications along the expected course of the ureters. There is bilateral interstitial thickening at the lung bases with bilateral small pleural effusions. The osseous structures are unremarkable. IMPRESSION: Orogastric tube with the tip projecting over the stomach. Electronically Signed   By: Kathreen Devoid   On: 01/10/2019 12:21   Ir Gastrostomy Tube Mod  Sed  Result Date: 01/28/2019 CLINICAL DATA:  Acute respiratory failure, chronic ventilatory support, poor PO intake, needs enteral feeding support EXAM: PERC PLACEMENT GASTROSTOMY FLUOROSCOPY TIME:  2.3 minute; 119  uGym2 DAP TECHNIQUE: The procedure, risks, benefits, and alternatives were explained to the family. Questions regarding the procedure were encouraged and answered. The patient understands and consents to the procedure. As antibiotic prophylaxis, cefazolin 2 g was ordered pre-procedure and administered intravenously within one hour of incision. A safe percutaneous approach was confirmed on recent CT abdomen. A 5 French angiographic catheter was placed as orogastric tube. The upper abdomen was prepped with Betadine, draped in usual sterile fashion, and infiltrated locally with 1% lidocaine. Intravenous Fentanyl 7mg and Versed '1mg'$  were administered as conscious sedation during continuous monitoring of the patient's level of consciousness and physiological / cardiorespiratory status by the radiology RN, with a total moderate sedation time of 10 minutes. 0.5 mg glucagon given IV to facilitate gastric distention.Stomach was insufflated using air through the orogastric tube. An 115French sheath needle was advanced percutaneously into the gastric lumen under fluoroscopy. Gas could be aspirated and a small contrast injection confirmed intraluminal spread. The sheath was exchanged over a guidewire for a 9 [place] sheath, through which the snare device was advanced and used to snare a guidewire passed through the orogastric tube. This was withdrawn, and the snare attached to the 20 French pull-through gastrostomy tube, which was advanced antegrade, positioned with the internal bumper securing the anterior gastric wall to the anterior abdominal wall. Small contrast injection confirms appropriate positioning. The external bumper was applied and the catheter was flushed. COMPLICATIONS: COMPLICATIONS none  IMPRESSION: 1. Technically successful 20 French pull-through gastrostomy placement under fluoroscopy. Electronically Signed   By: DLucrezia EuropeM.D.   On: 01/28/2019 14:36   Ir Fluoro Guide Cv Line Right  Result Date: 01/18/2019 INDICATION: 78year old female with renal injury, referred for hemodialysis catheter placement EXAM: IMAGE GUIDED HEMODIALYSIS CATHETER PLACEMENT MEDICATIONS: None ANESTHESIA/SEDATION: None FLUOROSCOPY TIME:  Fluoroscopy Time: 0 minutes 12 seconds COMPLICATIONS: None PROCEDURE: Informed written consent was obtained from the patient's family after a discussion of the risks, benefits, and alternatives to treatment. Questions regarding the procedure were encouraged and answered. The right neck was prepped with chlorhexidine in a sterile fashion, and a sterile drape was applied covering the operative field. Maximum barrier sterile technique with sterile gowns and gloves were used for the procedure. A timeout was performed prior to the initiation of the procedure. A micropuncture kit was utilized to access the right internal jugular vein under direct, real-time ultrasound guidance after the overlying soft tissues were anesthetized with 1% lidocaine with epinephrine. Ultrasound image documentation was performed. The microwire  was kinked to measure appropriate catheter length. A stiff glidewire was advanced to the level of the IVC. A 15 cm hemodialysis catheter was then placed over the wire. Final catheter positioning was confirmed and documented with a spot radiographic image. The catheter aspirates and flushes normally. The catheter was flushed with appropriate volume heparin dwells. Dressings were applied. The patient tolerated the procedure well without immediate post procedural complication. IMPRESSION: Status post image guided placement of right IJ temporary hemodialysis catheter. Signed, Dulcy Fanny. Dellia Nims, RPVI [facility] Radiology  Electronically Signed   By: Corrie Mckusick D.O.   On: 01/18/2019 17:04   Ir US Guide Vasc Access Right  Result Date: 01/18/2019 INDICATION: 78 year old female with renal injury, referred for hemodialysis catheter placement EXAM: IMAGE GUIDED HEMODIALYSIS CATHETER PLACEMENT MEDICATIONS: None ANESTHESIA/SEDATION: None FLUOROSCOPY TIME:  Fluoroscopy Time: 0 minutes 12 seconds COMPLICATIONS: None PROCEDURE: Informed written consent was obtained from the patient's family after a discussion of the risks, benefits, and alternatives to treatment. Questions regarding the procedure were encouraged and answered. The right neck was prepped with chlorhexidine in a sterile fashion, and a sterile drape was applied covering the operative field. Maximum barrier sterile technique with sterile gowns and gloves were used for the procedure. A timeout was performed prior to the initiation of the procedure. A micropuncture kit was utilized to access the right internal jugular vein under direct, real-time ultrasound guidance after the overlying soft tissues were anesthetized with 1% lidocaine with epinephrine. Ultrasound image documentation was performed. The microwire was kinked to measure appropriate catheter length. A stiff glidewire was advanced to the level of the IVC. A 15 cm hemodialysis catheter was then placed over the wire. Final catheter positioning was confirmed and documented with a spot radiographic image. The catheter aspirates and flushes normally. The catheter was flushed with appropriate volume heparin dwells. Dressings were applied. The patient tolerated the procedure well without immediate post procedural complication. IMPRESSION: Status post image guided placement of right IJ temporary hemodialysis catheter. Signed, Dulcy Fanny. Dellia Nims, RPVI [facility] Radiology Electronically Signed   By: Corrie Mckusick D.O.   On: 01/18/2019 17:04   Dg Chest Port 1 View  Result  Date: 01/15/2019 CLINICAL DATA:  Fever. EXAM: PORTABLE CHEST 1 VIEW COMPARISON:  Radiograph of January 02, 2019. FINDINGS: Stable cardiomegaly. Endotracheal tube has been removed and tracheostomy tube is noted in grossly good position. Feeding tube is seen entering stomach. Left internal jugular dialysis catheter is noted with distal tip in expected position of cavoatrial junction. No pneumothorax is noted. Diffuse bilateral lung opacities are noted concerning for edema or pneumonia. Small right pleural effusion is noted. Bony thorax is unremarkable. IMPRESSION: Tracheostomy and feeding tubes in good position. Left internal jugular catheter is noted in grossly good position. Bilateral lung opacities are noted concerning for edema or possibly pneumonia. Small right pleural effusion. Electronically Signed   By: Marijo Conception M.D.   On: 01/15/2019 18:41   Dg Abd Portable 1v  Result Date: 01/12/2019 CLINICAL DATA:  NG tube placement Hx acute lower GI bleeding, chronic kidney disease EXAM: PORTABLE ABDOMEN - 1 VIEW COMPARISON:  01/10/2019 FINDINGS: Weighted-tip feeding tube extends to the region of the gastric antrum. Cholecystectomy clips. Endoscopic clip in the right mid abdomen. Normal bowel gas pattern. Orthopedic hardware in the right femur partially visualized. IMPRESSION: 1. Feeding tube placement to the gastric antrum. 2. Normal bowel gas pattern. Electronically Signed   By: Keturah Barre  Vernard Gambles M.D.   On: 01/12/2019 17:01    Labs:  CBC: Recent Labs    02/01/19 0831 02/03/19 0639 02/04/19 0847 02/05/19 0619  WBC 6.5 4.9 6.8 8.0  HGB 7.4* 7.3* 6.8* 8.1*  HCT 22.9* 22.8* 21.1* 24.9*  PLT 132* 123* 133* 120*    COAGS: Recent Labs    01/02/19 0557  INR 1.2    BMP: Recent Labs    02/01/19 0831 02/03/19 0639 02/04/19 0847 02/05/19 0619  NA 131* 130* 136 132*  K 4.1 3.9 3.5 3.5  CL 95* 94* 100 98  CO2 _0 GLUCOSE 93 90 91 106*  BUN 31* 26* 15 25*  CALCIUM 9.4 9.3 8.8* 8.8*    CREATININE 2.60* 2.10* 1.50* 2.02*  GFRNONAA 17* 22* 33* 23*  GFRAA 20* 25* 38* 27*    LIVER FUNCTION TESTS: Recent Labs    01/02/19 0557  02/01/19 0831 02/03/19 0639 02/04/19 0847 02/05/19 0619  BILITOT 1.1  --   --   --   --   --   AST 23  --   --   --   --   --   ALT 19  --   --   --   --   --   ALKPHOS 147*  --   --   --   --   --   PROT 5.6*  --   --   --   --   --   ALBUMIN 2.3*   < > 2.1* 2.2* 2.3* 2.1*   < > = values in this interval not displayed.    TUMOR MARKERS: No results for input(s): AFPTM, CEA, CA199, CHROMGRNA in the last 8760 hours.  Assessment and Plan:  78 y/o F with multiple medical problems ultimately requiring tracheostomy placement, gastrostomy placement and initiation of hemodialysis via right IJ temporary HD catheter placed 01/18/19 in IR. Request has been made to IR for conversion of the existing temporary HD catheter to a tunneled HD catheter for long term use.  Will plan for conversion of temp cath to tunneled HD catheter Monday 10/19 pending an emergent IR procedures. Per medication list and RN patient is not currently on any blood thinning medications currently due to previous issues with bleeding. I have placed orders in [facility] paper chart for patient to have tube feeds held at midnight on 10/19, pre-procedure labs to be drawn AM of 10/19 and to hold any heparin/lovenox until post procedure.  Attempted to reach patient's son Randall Hiss today for consent with no answer - IR PA will call for consent tomorrow morning prior to procedure.  Please call IR with questions or concerns.   Thank you for this interesting consult.  I greatly enjoyed meeting [place] Searles and look forward to participating in their care.  A copy of this report was sent to the requesting provider on this date.  Electronically Signed: Joaquim Nam, PA-C 02/07/2019, 12:32 PM   I spent a total of 20 Minutes in face to face in clinical consultation, greater than 50% of which was  counseling/coordinating care for conversion of temp HD catheter to tunneled HD catheter.

## 2019-02-08 ENCOUNTER — Encounter (HOSPITAL_COMMUNITY): Payer: Self-pay | Admitting: Interventional Radiology

## 2019-02-08 ENCOUNTER — Other Ambulatory Visit (HOSPITAL_COMMUNITY): Payer: Medicare Other

## 2019-02-08 DIAGNOSIS — J159 Unspecified bacterial pneumonia: Secondary | ICD-10-CM | POA: Diagnosis not present

## 2019-02-08 DIAGNOSIS — K922 Gastrointestinal hemorrhage, unspecified: Secondary | ICD-10-CM | POA: Diagnosis not present

## 2019-02-08 DIAGNOSIS — N184 Chronic kidney disease, stage 4 (severe): Secondary | ICD-10-CM | POA: Diagnosis not present

## 2019-02-08 DIAGNOSIS — J9621 Acute and chronic respiratory failure with hypoxia: Secondary | ICD-10-CM | POA: Diagnosis not present

## 2019-02-08 HISTORY — PX: IR FLUORO GUIDE CV LINE RIGHT: IMG2283

## 2019-02-08 HISTORY — PX: IR US GUIDE VASC ACCESS RIGHT: IMG2390

## 2019-02-08 LAB — BPAM RBC
Blood Product Expiration Date: 202011052359
Blood Product Expiration Date: 202011052359
ISSUE DATE / TIME: 202010151807
Unit Type and Rh: 6200
Unit Type and Rh: 6200

## 2019-02-08 LAB — RENAL FUNCTION PANEL
Albumin: 2.1 g/dL — ABNORMAL LOW (ref 3.5–5.0)
Anion gap: 9 (ref 5–15)
BUN: 37 mg/dL — ABNORMAL HIGH (ref 8–23)
CO2: 26 mmol/L (ref 22–32)
Calcium: 9.3 mg/dL (ref 8.9–10.3)
Chloride: 93 mmol/L — ABNORMAL LOW (ref 98–111)
Creatinine, Ser: 2.27 mg/dL — ABNORMAL HIGH (ref 0.44–1.00)
GFR calc Af Amer: 23 mL/min — ABNORMAL LOW (ref >60)
GFR calc non Af Amer: 20 mL/min — ABNORMAL LOW (ref >60)
Glucose, Bld: 104 mg/dL — ABNORMAL HIGH (ref 70–99)
Phosphorus: 1.8 mg/dL — ABNORMAL LOW (ref 2.5–4.6)
Potassium: 3.9 mmol/L (ref 3.5–5.1)
Sodium: 128 mmol/L — ABNORMAL LOW (ref 135–145)

## 2019-02-08 LAB — TYPE AND SCREEN
ABO/RH(D): A POS
Antibody Screen: POSITIVE
Donor AG Type: NEGATIVE
Unit division: 0
Unit division: 0

## 2019-02-08 LAB — CBC
HCT: 25.3 % — ABNORMAL LOW (ref 36.0–46.0)
Hemoglobin: 8.4 g/dL — ABNORMAL LOW (ref 12.0–15.0)
MCH: 30.1 pg (ref 26.0–34.0)
MCHC: 33.2 g/dL (ref 30.0–36.0)
MCV: 90.7 fL (ref 80.0–100.0)
Platelets: 124 10*3/uL — ABNORMAL LOW (ref 150–400)
RBC: 2.79 MIL/uL — ABNORMAL LOW (ref 3.87–5.11)
RDW: 15.9 % — ABNORMAL HIGH (ref 11.5–15.5)
WBC: 5.3 10*3/uL (ref 4.0–10.5)
nRBC: 0 % (ref 0.0–0.2)

## 2019-02-08 MED ORDER — HEPARIN SODIUM (PORCINE) 1000 UNIT/ML IJ SOLN
INTRAMUSCULAR | Status: AC
  Filled 2019-02-08: qty 1

## 2019-02-08 MED ORDER — FENTANYL CITRATE (PF) 100 MCG/2ML IJ SOLN
INTRAMUSCULAR | Status: AC | PRN
  Administered 2019-02-08: 25 ug via INTRAVENOUS

## 2019-02-08 MED ORDER — CHLORHEXIDINE GLUCONATE 4 % EX LIQD
CUTANEOUS | Status: AC
  Filled 2019-02-08: qty 15

## 2019-02-08 MED ORDER — LIDOCAINE HCL 1 % IJ SOLN
INTRAMUSCULAR | Status: AC
  Filled 2019-02-08: qty 20

## 2019-02-08 MED ORDER — FENTANYL CITRATE (PF) 100 MCG/2ML IJ SOLN
INTRAMUSCULAR | Status: AC
  Filled 2019-02-08: qty 2

## 2019-02-08 MED ORDER — MIDAZOLAM HCL 2 MG/2ML IJ SOLN
INTRAMUSCULAR | Status: AC
  Filled 2019-02-08: qty 2

## 2019-02-08 MED ORDER — LIDOCAINE HCL 1 % IJ SOLN
INTRAMUSCULAR | Status: AC | PRN
  Administered 2019-02-08: 10 mL

## 2019-02-08 MED ORDER — MIDAZOLAM HCL 2 MG/2ML IJ SOLN
INTRAMUSCULAR | Status: AC | PRN
  Administered 2019-02-08: 1 mg via INTRAVENOUS

## 2019-02-08 MED ORDER — CEFAZOLIN SODIUM-DEXTROSE 2-4 GM/100ML-% IV SOLN
INTRAVENOUS | Status: AC
  Administered 2019-02-08: 2000 mg
  Filled 2019-02-08: qty 100

## 2019-02-08 NOTE — Progress Notes (Signed)
[facility]  ROUNDING NOTE   Subjective:  Patient due for dialysis treatment today. Resting comfortably in bed. Still requiring ventilatory support.   Objective:  Vital signs in last 24 hours:  Temperature 95.1 pulse 68 respirations 23 blood pressure 120/60   Physical Exam: General: Critically ill-appearing  Head: Normocephalic/atraumatic  Eyes: Anicteric  Neck: Supple, trachea midline  Lungs:  Scattered rhonchi rales, vent assisted  Heart: S1S2 no rubs  Abdomen:  Soft, bowel sounds present, PEG tube noted  Extremities: 2+ peripheral edema.  Neurologic: Awake, follows simple commands  Skin: Scattered ecchymoses  Access: Temporary right internal jugular dialysis catheter    Basic Metabolic Panel: Recent Labs  Lab 02/03/19 0639 02/04/19 0847 02/05/19 0619  NA 130* 136 132*  K 3.9 3.5 3.5  CL 94* 100 98  CO2 24 29 26   GLUCOSE 90 91 106*  BUN 26* 15 25*  CREATININE 2.10* 1.50* 2.02*  CALCIUM 9.3 8.8* 8.8*  MG  --  1.9  --   PHOS 1.7* 2.0* 2.0*    Liver Function Tests: Recent Labs  Lab 02/03/19 0639 02/04/19 0847 02/05/19 0619  ALBUMIN 2.2* 2.3* 2.1*   No results for input(s): LIPASE, AMYLASE in the last 168 hours. No results for input(s): AMMONIA in the last 168 hours.  CBC: Recent Labs  Lab 02/03/19 0639 02/04/19 0847 02/05/19 0619  WBC 4.9 6.8 8.0  HGB 7.3* 6.8* 8.1*  HCT 22.8* 21.1* 24.9*  MCV 92.3 91.7 91.5  PLT 123* 133* 120*    Cardiac Enzymes: No results for input(s): CKTOTAL, CKMB, CKMBINDEX, TROPONINI in the last 168 hours.  BNP: Invalid input(s): POCBNP  CBG: No results for input(s): GLUCAP in the last 168 hours.  Microbiology: Results for orders placed or performed during the hospital encounter of 01/22/19  SARS Coronavirus 2 [facility] order, Performed in [facility] hospital lab) Nasopharyngeal Nasopharyngeal Swab     Status: None   Collection Time: 01/27/19  2:40 PM   Specimen: Nasopharyngeal Swab  Result Value  Ref Range Status   SARS Coronavirus 2 NEGATIVE NEGATIVE Final    Comment: (NOTE) If result is NEGATIVE SARS-CoV-2 target nucleic acids are NOT DETECTED. The SARS-CoV-2 RNA is generally detectable in upper and lower  respiratory specimens during the acute phase of infection. The lowest  concentration of SARS-CoV-2 viral copies this assay can detect is 250  copies / mL. A negative result does not preclude SARS-CoV-2 infection  and should not be used as the sole basis for treatment or other  patient management decisions.  A negative result may occur with  improper specimen collection / handling, submission of specimen other  than nasopharyngeal swab, presence of viral mutation(s) within the  areas targeted by this assay, and inadequate number of viral copies  (<250 copies / mL). A negative result must be combined with clinical  observations, patient history, and epidemiological information. If result is POSITIVE SARS-CoV-2 target nucleic acids are DETECTED. The SARS-CoV-2 RNA is generally detectable in upper and lower  respiratory specimens dur ing the acute phase of infection.  Positive  results are indicative of active infection with SARS-CoV-2.  Clinical  correlation with patient history and other diagnostic information is  necessary to determine patient infection status.  Positive results do  not rule out bacterial infection or co-infection with other viruses. If result is PRESUMPTIVE POSTIVE SARS-CoV-2 nucleic acids MAY BE PRESENT.   A presumptive positive result was obtained on the submitted specimen  and confirmed on repeat testing.  While 2019 novel coronavirus  (SARS-CoV-2) nucleic acids may be present in the submitted sample  additional confirmatory testing may be necessary for epidemiological  and / or clinical management purposes  to differentiate between  SARS-CoV-2 and other Sarbecovirus currently known to infect humans.  If clinically indicated additional testing with an  alternate test  methodology [phone]) is advised. The SARS-CoV-2 RNA is generally  detectable in upper and lower respiratory sp ecimens during the acute  phase of infection. The expected result is Negative. Fact Sheet for Patients:  StrictlyIdeas.no Fact Sheet for Healthcare Providers: BankingDealers.co.za This test is not yet approved or cleared by the [place] FDA and has been authorized for detection and/or diagnosis of SARS-CoV-2 by FDA under an Emergency Use Authorization (EUA).  This EUA will remain in effect (meaning this test can be used) for the duration of the COVID-19 declaration under Section 564(b)(1) of the Act, 21 U.S.C. section 360bbb-3(b)(1), unless the authorization is terminated or revoked sooner. Performed at [facility], [place] [address] [place], [place] [address]   C difficile quick scan w PCR reflex     Status: Abnormal   Collection Time: 02/02/19 10:51 PM   Specimen: STOOL  Result Value Ref Range Status   C Diff antigen POSITIVE (A) NEGATIVE Final    Comment: CRITICAL RESULT CALLED TO, READ BACK BY AND VERIFIED WITH: C.MASEKO,RN AT 2330 BY L.PITT 02/03/2019    C Diff toxin POSITIVE (A) NEGATIVE Final   C Diff interpretation Toxin producing C. difficile detected.  Final    Comment: CRITICAL RESULT CALLED TO, READ BACK BY AND VERIFIED WITH: C.MASEKO,RN AT 2330 BY L.PITT 02/03/2019 Performed at [facility] [address], [place], [place] [address]     Coagulation Studies: No results for input(s): LABPROT, INR in the last 72 hours.  Urinalysis: No results for input(s): COLORURINE, LABSPEC, PHURINE, GLUCOSEU, HGBUR, BILIRUBINUR, KETONESUR, PROTEINUR, UROBILINOGEN, NITRITE, LEUKOCYTESUR in the last 72 hours.  Invalid input(s): APPERANCEUR    Imaging: No results found.   Medications:       Assessment/ Plan:  78 y.o. female with a PMHx of congestive heart failure, rheumatoid  arthritis, chronic kidney disease stage IV, hyperlipidemia, fibromyalgia, giant cell arteritis, hypertension, thoracic ascending aortic aneurysm, duodenal ulcers, severe diverticulosis, coronary artery disease, hypothyroidism, who was admitted to [facility] on 01/01/2019 for ongoing treatment of acute respiratory failure, generalized debility, and severe renal dysfunction.   1.  Acute renal failure/chronic kidney disease stage IV.  Patient due for dialysis treatment today.  Orders have been prepared.  Continue dialysis on MWF schedule.  2.  Acute respiratory failure.  Patient maintained on the ventilator at this time.  Continue to monitor respiratory status.  3.  Anemia chronic kidney disease.  Hemoglobin did decline over the weekend again and was as low as 6.8.  However patient status post transfusion and hemoglobin currently 8.1.    LOS: 0 Tyreck Bell 10/19/20208:38 AM

## 2019-02-08 NOTE — Progress Notes (Addendum)
Pulmonary Critical Care Medicine [place]   PULMONARY CRITICAL CARE SERVICE  PROGRESS NOTE  Date of Service: 02/08/2019  Stephanie Stark  [id number]  DOB: Jun 12, 1940   DOA: 01/22/2019  Referring Physician: Merton Border, MD  HPI: Stephanie Stark is a 78 y.o. female seen for follow up of Acute on Chronic Respiratory Failure.  Patient remains on pressure support 12 FiO2 28% as tolerated.  Medications: Reviewed on Rounds  Physical Exam:  Vitals: Pulse 80 respirations 28 BP 138/45 O2 sat 91% temp 97.4  Ventilator Settings were 12 FiO2 28%  . General: Comfortable at this time . Eyes: Grossly normal lids, irises & conjunctiva . ENT: grossly tongue is normal . Neck: no obvious mass . Cardiovascular: S1 S2 normal no gallop . Respiratory: No rales or rhonchi noted . Abdomen: soft . Skin: no rash seen on limited exam . Musculoskeletal: not rigid . Psychiatric:unable to assess . Neurologic: no seizure no involuntary movements         Lab Data:   Basic Metabolic Panel: Recent Labs  Lab 02/01/19 0831 02/03/19 0639 02/04/19 0847 02/05/19 0619  NA 131* 130* 136 132*  K 4.1 3.9 3.5 3.5  CL 95* 94* 100 98  CO2 27 24 29 26   GLUCOSE 93 90 91 106*  BUN 31* 26* 15 25*  CREATININE 2.60* 2.10* 1.50* 2.02*  CALCIUM 9.4 9.3 8.8* 8.8*  MG  --   --  1.9  --   PHOS 1.6* 1.7* 2.0* 2.0*    ABG: No results for input(s): PHART, PCO2ART, PO2ART, HCO3, O2SAT in the last 168 hours.  Liver Function Tests: Recent Labs  Lab 02/01/19 0831 02/03/19 0639 02/04/19 0847 02/05/19 0619  ALBUMIN 2.1* 2.2* 2.3* 2.1*   No results for input(s): LIPASE, AMYLASE in the last 168 hours. No results for input(s): AMMONIA in the last 168 hours.  CBC: Recent Labs  Lab 02/01/19 0831 02/03/19 0639 02/04/19 0847 02/05/19 0619  WBC 6.5 4.9 6.8 8.0  HGB 7.4* 7.3* 6.8* 8.1*  HCT 22.9* 22.8* 21.1* 24.9*  MCV 90.9 92.3 91.7 91.5  PLT 132* 123* 133* 120*    Cardiac  Enzymes: No results for input(s): CKTOTAL, CKMB, CKMBINDEX, TROPONINI in the last 168 hours.  BNP (last 3 results) No results for input(s): BNP in the last 8760 hours.  ProBNP (last 3 results) No results for input(s): PROBNP in the last 8760 hours.  Radiological Exams: No results found.  Assessment/Plan Active Problems:   Acute on chronic respiratory failure with hypoxia (HCC)   Chronic kidney disease, stage IV (severe) (HCC)   Acute lower GI bleeding   Bacterial lobar pneumonia   1. Acute on chronic respiratory failure with hypoxia continue to wean per protocol.  Continue pulmonary toilet. 2. Chronic kidney disease stage IV we will continue with supportive care nephrology recommendations 3. Acute GI bleed no active bleeding 4. Bacterial pneumonia treated we will continue to monitor   I have personally seen and evaluated the patient, evaluated laboratory and imaging results, formulated the assessment and plan and placed orders. The Patient requires high complexity decision making for assessment and support.  Case was discussed on Rounds with the Respiratory Therapy Staff  Allyne Gee, MD [facility] Pulmonary Critical Care Medicine Sleep Medicine

## 2019-02-08 NOTE — Sedation Documentation (Signed)
Patient on ventilator. Unable to monitor ETCO2 for case.

## 2019-02-08 NOTE — Procedures (Signed)
Interventional Radiology Procedure Note  Procedure: Right IJ tunneled HD catheter placement  Complications: None  Estimated Blood Loss: < 10 mL  Findings: 19 cm tip to cuff length Palindrome catheter placed via right IJ with tip at SVC/RA junction. OK to use.  Venetia Night. Kathlene Cote, M.D Pager:  [phone]

## 2019-02-08 NOTE — Progress Notes (Signed)
Pulmonary Critical Care Medicine [place]   PULMONARY CRITICAL CARE SERVICE  PROGRESS NOTE  Date of Service: 02/08/2019  Stephanie Stark  [name]  DOB: 26-May-1940   DOA: 01/22/2019  Referring Physician: Merton Border, MD  HPI: Stephanie Stark is a 78 y.o. female seen for follow up of Acute on Chronic Respiratory Failure.  Patient currently is on pressure support mode 12/5 seems to be tolerating it well plan is to continue to try to advance the wean  Medications: Reviewed on Rounds  Physical Exam:  Vitals: Temperature 96.1 pulse 68 respiratory rate 23 blood pressure 120/66 saturations 100%  Ventilator Settings mode ventilation pressure support FiO2 28% tidal volume 350 pressure poor 12 PEEP 5  . General: Comfortable at this time . Eyes: Grossly normal lids, irises & conjunctiva . ENT: grossly tongue is normal . Neck: no obvious mass . Cardiovascular: S1 S2 normal no gallop . Respiratory: No rhonchi no rales are noted at this time . Abdomen: soft . Skin: no rash seen on limited exam . Musculoskeletal: not rigid . Psychiatric:unable to assess . Neurologic: no seizure no involuntary movements         Lab Data:   Basic Metabolic Panel: Recent Labs  Lab 02/03/19 0639 02/04/19 0847 02/05/19 0619  NA 130* 136 132*  K 3.9 3.5 3.5  CL 94* 100 98  CO2 24 29 26   GLUCOSE 90 91 106*  BUN 26* 15 25*  CREATININE 2.10* 1.50* 2.02*  CALCIUM 9.3 8.8* 8.8*  MG  --  1.9  --   PHOS 1.7* 2.0* 2.0*    ABG: No results for input(s): PHART, PCO2ART, PO2ART, HCO3, O2SAT in the last 168 hours.  Liver Function Tests: Recent Labs  Lab 02/03/19 0639 02/04/19 0847 02/05/19 0619  ALBUMIN 2.2* 2.3* 2.1*   No results for input(s): LIPASE, AMYLASE in the last 168 hours. No results for input(s): AMMONIA in the last 168 hours.  CBC: Recent Labs  Lab 02/03/19 0639 02/04/19 0847 02/05/19 0619  WBC 4.9 6.8 8.0  HGB 7.3* 6.8* 8.1*  HCT 22.8* 21.1* 24.9*   MCV 92.3 91.7 91.5  PLT 123* 133* 120*    Cardiac Enzymes: No results for input(s): CKTOTAL, CKMB, CKMBINDEX, TROPONINI in the last 168 hours.  BNP (last 3 results) No results for input(s): BNP in the last 8760 hours.  ProBNP (last 3 results) No results for input(s): PROBNP in the last 8760 hours.  Radiological Exams: No results found.  Assessment/Plan Active Problems:   Acute on chronic respiratory failure with hypoxia (HCC)   Chronic kidney disease, stage IV (severe) (HCC)   Acute lower GI bleeding   Bacterial lobar pneumonia   1. Acute on chronic respiratory failure with hypoxia continue to try to get tolerate as tolerated.  Continue pulmonary toilet supportive care 2. Chronic kidney disease stage IV we will continue with present management 3. Acute lower GI bleed unchanged no active bleeding noted at this time 4. Bacterial pneumonia treated   I have personally seen and evaluated the patient, evaluated laboratory and imaging results, formulated the assessment and plan and placed orders. The Patient requires high complexity decision making for assessment and support.  Case was discussed on Rounds with the Respiratory Therapy Staff  Allyne Gee, MD [facility] Pulmonary Critical Care Medicine Sleep Medicine

## 2019-02-09 DIAGNOSIS — J9621 Acute and chronic respiratory failure with hypoxia: Secondary | ICD-10-CM | POA: Diagnosis not present

## 2019-02-09 DIAGNOSIS — N184 Chronic kidney disease, stage 4 (severe): Secondary | ICD-10-CM | POA: Diagnosis not present

## 2019-02-09 DIAGNOSIS — J159 Unspecified bacterial pneumonia: Secondary | ICD-10-CM | POA: Diagnosis not present

## 2019-02-09 DIAGNOSIS — K922 Gastrointestinal hemorrhage, unspecified: Secondary | ICD-10-CM | POA: Diagnosis not present

## 2019-02-09 NOTE — Progress Notes (Signed)
Pulmonary Critical Care Medicine [place]   PULMONARY CRITICAL CARE SERVICE  PROGRESS NOTE  Date of Service: 02/09/2019  Stephanie Stark  [name]  DOB: 10-Mar-1941   DOA: 01/22/2019  Referring Physician: Merton Border, MD  HPI: Stephanie Stark is a 78 y.o. female seen for follow up of Acute on Chronic Respiratory Failure.  Patient now is on full support has been on assist control mode and is requiring 28% FiO2  Medications: Reviewed on Rounds  Physical Exam:  Vitals: Temperature 97.3 pulse 87 respiratory rate 30 blood pressure 160/85 saturations 98%  Ventilator Settings patient currently is on assist control FiO2 28% PEEP 5 tidal line 330  . General: Comfortable at this time . Eyes: Grossly normal lids, irises & conjunctiva . ENT: grossly tongue is normal . Neck: no obvious mass . Cardiovascular: S1 S2 normal no gallop . Respiratory: No rhonchi coarse breath sounds . Abdomen: soft . Skin: no rash seen on limited exam . Musculoskeletal: not rigid . Psychiatric:unable to assess . Neurologic: no seizure no involuntary movements         Lab Data:   Basic Metabolic Panel: Recent Labs  Lab 02/03/19 0639 02/04/19 0847 02/05/19 0619 02/08/19 0837  NA 130* 136 132* 128*  K 3.9 3.5 3.5 3.9  CL 94* 100 98 93*  CO2 24 29 26 26   GLUCOSE 90 91 106* 104*  BUN 26* 15 25* 37*  CREATININE 2.10* 1.50* 2.02* 2.27*  CALCIUM 9.3 8.8* 8.8* 9.3  MG  --  1.9  --   --   PHOS 1.7* 2.0* 2.0* 1.8*    ABG: No results for input(s): PHART, PCO2ART, PO2ART, HCO3, O2SAT in the last 168 hours.  Liver Function Tests: Recent Labs  Lab 02/03/19 0639 02/04/19 0847 02/05/19 0619 02/08/19 0837  ALBUMIN 2.2* 2.3* 2.1* 2.1*   No results for input(s): LIPASE, AMYLASE in the last 168 hours. No results for input(s): AMMONIA in the last 168 hours.  CBC: Recent Labs  Lab 02/03/19 0639 02/04/19 0847 02/05/19 0619 02/08/19 0837  WBC 4.9 6.8 8.0 5.3  HGB 7.3* 6.8*  8.1* 8.4*  HCT 22.8* 21.1* 24.9* 25.3*  MCV 92.3 91.7 91.5 90.7  PLT 123* 133* 120* 124*    Cardiac Enzymes: No results for input(s): CKTOTAL, CKMB, CKMBINDEX, TROPONINI in the last 168 hours.  BNP (last 3 results) No results for input(s): BNP in the last 8760 hours.  ProBNP (last 3 results) No results for input(s): PROBNP in the last 8760 hours.  Radiological Exams: Ir Fluoro Guide Cv Line Right  Result Date: 02/08/2019 CLINICAL DATA:  Renal failure and need for tunneled hemodialysis catheter. Current non tunneled temporary dialysis catheter via a right internal jugular vein placed on 01/18/2019. EXAM: TUNNELED CENTRAL VENOUS HEMODIALYSIS CATHETER PLACEMENT WITH ULTRASOUND AND FLUOROSCOPIC GUIDANCE ANESTHESIA/SEDATION: 1.0 mg IV Versed; 25 mcg IV Fentanyl. Total Moderate Sedation Time:   30 minutes. The patient's level of consciousness and physiologic status were continuously monitored during the procedure by Radiology nursing. MEDICATIONS: 2 g IV Ancef. FLUOROSCOPY TIME:  30 seconds.  1.5 mGy. PROCEDURE: The procedure, risks, benefits, and alternatives were explained to the patient's son. Questions regarding the procedure were encouraged and answered. The patient's son understands and consents to the procedure. A timeout was performed prior to initiating the procedure. The pre-existing non tunneled temporary dialysis catheter was removed and hemostasis obtained with manual compression. The right neck and chest were prepped with chlorhexidine in a sterile fashion, and a sterile drape was  applied covering the operative field. Maximum barrier sterile technique with sterile gowns and gloves were used for the procedure. Local anesthesia was provided with 1% lidocaine. Ultrasound was used to confirm patency of the right internal jugular vein. After creating a small venotomy incision, a 21 gauge needle was advanced into the right internal jugular vein under direct, real-time ultrasound guidance.  Ultrasound image documentation was performed. After securing guidewire access, an 8 Fr dilator was placed. A J-wire was kinked to measure appropriate catheter length. A Palindrome tunneled hemodialysis catheter measuring 19 cm from tip to cuff was chosen for placement. This was tunneled in a retrograde fashion from the chest wall to the venotomy incision. At the venotomy, serial dilatation was performed and a 16 Fr peel-away sheath was placed over a guidewire. The catheter was then placed through the sheath and the sheath removed. Final catheter positioning was confirmed and documented with a fluoroscopic spot image. The catheter was aspirated, flushed with saline, and injected with appropriate volume heparin dwells. The venotomy incision was closed with subcuticular 4-0 Vicryl. Dermabond was applied to the incision. The catheter exit site was secured with 0-Prolene retention sutures. COMPLICATIONS: None.  No pneumothorax. FINDINGS: After catheter placement, the tip lies at the SVC/RA junction. The catheter aspirates normally and is ready for immediate use. IMPRESSION: Placement of tunneled hemodialysis catheter via the right internal jugular vein after removal of the non tunneled dialysis catheter. The catheter tip lies at the SVC/RA junction. The catheter is ready for immediate use. Electronically Signed   By: Aletta Edouard M.D.   On: 02/08/2019 11:24   Ir US Guide Vasc Access Right  Result Date: 02/08/2019 CLINICAL DATA:  Renal failure and need for tunneled hemodialysis catheter. Current non tunneled temporary dialysis catheter via a right internal jugular vein placed on 01/18/2019. EXAM: TUNNELED CENTRAL VENOUS HEMODIALYSIS CATHETER PLACEMENT WITH ULTRASOUND AND FLUOROSCOPIC GUIDANCE ANESTHESIA/SEDATION: 1.0 mg IV Versed; 25 mcg IV Fentanyl. Total Moderate Sedation Time:   30 minutes. The patient's level of consciousness and physiologic status were continuously monitored during the procedure by Radiology  nursing. MEDICATIONS: 2 g IV Ancef. FLUOROSCOPY TIME:  30 seconds.  1.5 mGy. PROCEDURE: The procedure, risks, benefits, and alternatives were explained to the patient's son. Questions regarding the procedure were encouraged and answered. The patient's son understands and consents to the procedure. A timeout was performed prior to initiating the procedure. The pre-existing non tunneled temporary dialysis catheter was removed and hemostasis obtained with manual compression. The right neck and chest were prepped with chlorhexidine in a sterile fashion, and a sterile drape was applied covering the operative field. Maximum barrier sterile technique with sterile gowns and gloves were used for the procedure. Local anesthesia was provided with 1% lidocaine. Ultrasound was used to confirm patency of the right internal jugular vein. After creating a small venotomy incision, a 21 gauge needle was advanced into the right internal jugular vein under direct, real-time ultrasound guidance. Ultrasound image documentation was performed. After securing guidewire access, an 8 Fr dilator was placed. A J-wire was kinked to measure appropriate catheter length. A Palindrome tunneled hemodialysis catheter measuring 19 cm from tip to cuff was chosen for placement. This was tunneled in a retrograde fashion from the chest wall to the venotomy incision. At the venotomy, serial dilatation was performed and a 16 Fr peel-away sheath was placed over a guidewire. The catheter was then placed through the sheath and the sheath removed. Final catheter positioning was confirmed and documented with a fluoroscopic spot  image. The catheter was aspirated, flushed with saline, and injected with appropriate volume heparin dwells. The venotomy incision was closed with subcuticular 4-0 Vicryl. Dermabond was applied to the incision. The catheter exit site was secured with 0-Prolene retention sutures. COMPLICATIONS: None.  No pneumothorax. FINDINGS: After  catheter placement, the tip lies at the SVC/RA junction. The catheter aspirates normally and is ready for immediate use. IMPRESSION: Placement of tunneled hemodialysis catheter via the right internal jugular vein after removal of the non tunneled dialysis catheter. The catheter tip lies at the SVC/RA junction. The catheter is ready for immediate use. Electronically Signed   By: Aletta Edouard M.D.   On: 02/08/2019 11:24   Dg Chest Port 1 View  Result Date: 02/08/2019 CLINICAL DATA:  Congestive heart failure.  Shortness of breath. EXAM: PORTABLE CHEST 1 VIEW COMPARISON:  Single-view of the chest 01/15/2019. FINDINGS: The left IJ approach dialysis catheter has been removed. There is a new right IJ approach dialysis catheter with its tip near the superior cavoatrial junction. Tracheostomy tube is unchanged. Feeding tube has been removed. Moderate bilateral pleural effusions, pulmonary edema and cardiomegaly are seen. No pneumothorax. Atherosclerosis noted. IMPRESSION: Tip of new dialysis catheter projects at the superior cavoatrial junction. No pneumothorax. Pulmonary edema with moderate bilateral pleural effusions. Atherosclerosis. Electronically Signed   By: Inge Rise M.D.   On: 02/08/2019 12:14    Assessment/Plan Active Problems:   Acute on chronic respiratory failure with hypoxia (HCC)   Chronic kidney disease, stage IV (severe) (HCC)   Acute lower GI bleeding   Bacterial lobar pneumonia   1. Acute on chronic respiratory failure with hypoxia patient is not doing very well as far as being able to wean today.  Patient did have a dialysis catheter placed 2. Chronic kidney disease followed by nephrology 3. Acute GI bleed no active bleeding 4. Lobar pneumonia treated chest film still showing some pulmonary edema and infiltrate   I have personally seen and evaluated the patient, evaluated laboratory and imaging results, formulated the assessment and plan and placed orders. The Patient  requires high complexity decision making for assessment and support.  Case was discussed on Rounds with the Respiratory Therapy Staff  Allyne Gee, MD [facility] Pulmonary Critical Care Medicine Sleep Medicine

## 2019-02-10 DIAGNOSIS — J9621 Acute and chronic respiratory failure with hypoxia: Secondary | ICD-10-CM | POA: Diagnosis not present

## 2019-02-10 DIAGNOSIS — K922 Gastrointestinal hemorrhage, unspecified: Secondary | ICD-10-CM | POA: Diagnosis not present

## 2019-02-10 DIAGNOSIS — N184 Chronic kidney disease, stage 4 (severe): Secondary | ICD-10-CM | POA: Diagnosis not present

## 2019-02-10 DIAGNOSIS — J159 Unspecified bacterial pneumonia: Secondary | ICD-10-CM | POA: Diagnosis not present

## 2019-02-10 LAB — RENAL FUNCTION PANEL
Albumin: 2.4 g/dL — ABNORMAL LOW (ref 3.5–5.0)
Anion gap: 10 (ref 5–15)
BUN: 36 mg/dL — ABNORMAL HIGH (ref 8–23)
CO2: 26 mmol/L (ref 22–32)
Calcium: 9.9 mg/dL (ref 8.9–10.3)
Chloride: 93 mmol/L — ABNORMAL LOW (ref 98–111)
Creatinine, Ser: 2.24 mg/dL — ABNORMAL HIGH (ref 0.44–1.00)
GFR calc Af Amer: 23 mL/min — ABNORMAL LOW (ref >60)
GFR calc non Af Amer: 20 mL/min — ABNORMAL LOW (ref >60)
Glucose, Bld: 128 mg/dL — ABNORMAL HIGH (ref 70–99)
Phosphorus: 1.5 mg/dL — ABNORMAL LOW (ref 2.5–4.6)
Potassium: 3.8 mmol/L (ref 3.5–5.1)
Sodium: 129 mmol/L — ABNORMAL LOW (ref 135–145)

## 2019-02-10 LAB — CBC
HCT: 26.4 % — ABNORMAL LOW (ref 36.0–46.0)
Hemoglobin: 8.5 g/dL — ABNORMAL LOW (ref 12.0–15.0)
MCH: 29.5 pg (ref 26.0–34.0)
MCHC: 32.2 g/dL (ref 30.0–36.0)
MCV: 91.7 fL (ref 80.0–100.0)
Platelets: 154 10*3/uL (ref 150–400)
RBC: 2.88 MIL/uL — ABNORMAL LOW (ref 3.87–5.11)
RDW: 16.1 % — ABNORMAL HIGH (ref 11.5–15.5)
WBC: 8.6 10*3/uL (ref 4.0–10.5)
nRBC: 0 % (ref 0.0–0.2)

## 2019-02-10 NOTE — Progress Notes (Signed)
Pulmonary Critical Care Medicine [place]   PULMONARY CRITICAL CARE SERVICE  PROGRESS NOTE  Date of Service: 02/10/2019  Stephanie Stark  [name]  DOB: 14-Oct-1940   DOA: 01/22/2019  Referring Physician: Merton Border, MD  HPI: Stephanie Stark is a 78 y.o. female seen for follow up of Acute on Chronic Respiratory Failure.  Patient has been started on dialysis by nephrology.  She is actually tolerating it well remains on pressure support mode at this time  Medications: Reviewed on Rounds  Physical Exam:  Vitals: Temperature 96.4 pulse 99 respiratory rate 32 blood pressure 160/54 saturations 96%  Ventilator Settings patient is on pressure support FiO2 28% pressure support 12 PEEP 5  . General: Comfortable at this time . Eyes: Grossly normal lids, irises & conjunctiva . ENT: grossly tongue is normal . Neck: no obvious mass . Cardiovascular: S1 S2 normal no gallop . Respiratory: No rhonchi no rales are noted . Abdomen: soft . Skin: no rash seen on limited exam . Musculoskeletal: not rigid . Psychiatric:unable to assess . Neurologic: no seizure no involuntary movements         Lab Data:   Basic Metabolic Panel: Recent Labs  Lab 02/04/19 0847 02/05/19 0619 02/08/19 0837 02/10/19 0549  NA 136 132* 128* 129*  K 3.5 3.5 3.9 3.8  CL 100 98 93* 93*  CO2 29 26 26 26   GLUCOSE 91 106* 104* 128*  BUN 15 25* 37* 36*  CREATININE 1.50* 2.02* 2.27* 2.24*  CALCIUM 8.8* 8.8* 9.3 9.9  MG 1.9  --   --   --   PHOS 2.0* 2.0* 1.8* 1.5*    ABG: No results for input(s): PHART, PCO2ART, PO2ART, HCO3, O2SAT in the last 168 hours.  Liver Function Tests: Recent Labs  Lab 02/04/19 0847 02/05/19 0619 02/08/19 0837 02/10/19 0549  ALBUMIN 2.3* 2.1* 2.1* 2.4*   No results for input(s): LIPASE, AMYLASE in the last 168 hours. No results for input(s): AMMONIA in the last 168 hours.  CBC: Recent Labs  Lab 02/04/19 0847 02/05/19 0619 02/08/19 0837  02/10/19 0549  WBC 6.8 8.0 5.3 8.6  HGB 6.8* 8.1* 8.4* 8.5*  HCT 21.1* 24.9* 25.3* 26.4*  MCV 91.7 91.5 90.7 91.7  PLT 133* 120* 124* 154    Cardiac Enzymes: No results for input(s): CKTOTAL, CKMB, CKMBINDEX, TROPONINI in the last 168 hours.  BNP (last 3 results) No results for input(s): BNP in the last 8760 hours.  ProBNP (last 3 results) No results for input(s): PROBNP in the last 8760 hours.  Radiological Exams: No results found.  Assessment/Plan Active Problems:   Acute on chronic respiratory failure with hypoxia (HCC)   Chronic kidney disease, stage IV (severe) (HCC)   Acute lower GI bleeding   Bacterial lobar pneumonia   1. Acute on chronic respiratory failure with hypoxia continue with full support on the ventilator at this time right now requiring pressure support once the patient's fluid status is improved we will work on weaning again 2. Chronic kidney disease stage IV we will continue with supportive care 3. Acute lower GI bleed no active bleeding 4. Bacterial pneumonia treated we will continue to follow   I have personally seen and evaluated the patient, evaluated laboratory and imaging results, formulated the assessment and plan and placed orders. The Patient requires high complexity decision making for assessment and support.  Case was discussed on Rounds with the Respiratory Therapy Staff  Allyne Gee, MD [facility] Pulmonary Critical Care Medicine Sleep Medicine

## 2019-02-10 NOTE — Progress Notes (Addendum)
[facility]  ROUNDING NOTE   Subjective:  Patient seen and evaluated during hemodialysis.  Tolerating well at the moment. Family considering taking patient home. She was made DNR.   Objective:  Vital signs in last 24 hours:  Temperature 96.4 pulse 99 respirations 32 blood pressure 160/54   Physical Exam: General: Critically ill-appearing  Head: Normocephalic/atraumatic  Eyes: Anicteric  Neck: Supple, trachea midline  Lungs:  Scattered rhonchi rales, vent assisted  Heart: S1S2 no rubs  Abdomen:  Soft, bowel sounds present, PEG tube noted  Extremities: 2+ peripheral edema.  Neurologic: Awake, follows simple commands  Skin: Scattered ecchymoses  Access: Temporary right internal jugular dialysis catheter    Basic Metabolic Panel: Recent Labs  Lab 02/04/19 0847 02/05/19 0619 02/08/19 0837 02/10/19 0549  NA 136 132* 128* 129*  K 3.5 3.5 3.9 3.8  CL 100 98 93* 93*  CO2 29 26 26 26   GLUCOSE 91 106* 104* 128*  BUN 15 25* 37* 36*  CREATININE 1.50* 2.02* 2.27* 2.24*  CALCIUM 8.8* 8.8* 9.3 9.9  MG 1.9  --   --   --   PHOS 2.0* 2.0* 1.8* 1.5*    Liver Function Tests: Recent Labs  Lab 02/04/19 0847 02/05/19 0619 02/08/19 0837 02/10/19 0549  ALBUMIN 2.3* 2.1* 2.1* 2.4*   No results for input(s): LIPASE, AMYLASE in the last 168 hours. No results for input(s): AMMONIA in the last 168 hours.  CBC: Recent Labs  Lab 02/04/19 0847 02/05/19 0619 02/08/19 0837 02/10/19 0549  WBC 6.8 8.0 5.3 8.6  HGB 6.8* 8.1* 8.4* 8.5*  HCT 21.1* 24.9* 25.3* 26.4*  MCV 91.7 91.5 90.7 91.7  PLT 133* 120* 124* 154    Cardiac Enzymes: No results for input(s): CKTOTAL, CKMB, CKMBINDEX, TROPONINI in the last 168 hours.  BNP: Invalid input(s): POCBNP  CBG: No results for input(s): GLUCAP in the last 168 hours.  Microbiology: Results for orders placed or performed during the hospital encounter of 01/22/19  SARS Coronavirus 2 [facility] order, Performed in [facility]  hospital lab) Nasopharyngeal Nasopharyngeal Swab     Status: None   Collection Time: 01/27/19  2:40 PM   Specimen: Nasopharyngeal Swab  Result Value Ref Range Status   SARS Coronavirus 2 NEGATIVE NEGATIVE Final    Comment: (NOTE) If result is NEGATIVE SARS-CoV-2 target nucleic acids are NOT DETECTED. The SARS-CoV-2 RNA is generally detectable in upper and lower  respiratory specimens during the acute phase of infection. The lowest  concentration of SARS-CoV-2 viral copies this assay can detect is 250  copies / mL. A negative result does not preclude SARS-CoV-2 infection  and should not be used as the sole basis for treatment or other  patient management decisions.  A negative result may occur with  improper specimen collection / handling, submission of specimen other  than nasopharyngeal swab, presence of viral mutation(s) within the  areas targeted by this assay, and inadequate number of viral copies  (<250 copies / mL). A negative result must be combined with clinical  observations, patient history, and epidemiological information. If result is POSITIVE SARS-CoV-2 target nucleic acids are DETECTED. The SARS-CoV-2 RNA is generally detectable in upper and lower  respiratory specimens dur ing the acute phase of infection.  Positive  results are indicative of active infection with SARS-CoV-2.  Clinical  correlation with patient history and other diagnostic information is  necessary to determine patient infection status.  Positive results do  not rule out bacterial infection or co-infection with other  viruses. If result is PRESUMPTIVE POSTIVE SARS-CoV-2 nucleic acids MAY BE PRESENT.   A presumptive positive result was obtained on the submitted specimen  and confirmed on repeat testing.  While 2019 novel coronavirus  (SARS-CoV-2) nucleic acids may be present in the submitted sample  additional confirmatory testing may be necessary for epidemiological  and / or clinical management  purposes  to differentiate between  SARS-CoV-2 and other Sarbecovirus currently known to infect humans.  If clinically indicated additional testing with an alternate test  methodology [id number]) is advised. The SARS-CoV-2 RNA is generally  detectable in upper and lower respiratory sp ecimens during the acute  phase of infection. The expected result is Negative. Fact Sheet for Patients:  StrictlyIdeas.no Fact Sheet for Healthcare Providers: BankingDealers.co.za This test is not yet approved or cleared by the [place] FDA and has been authorized for detection and/or diagnosis of SARS-CoV-2 by FDA under an Emergency Use Authorization (EUA).  This EUA will remain in effect (meaning this test can be used) for the duration of the COVID-19 declaration under Section 564(b)(1) of the Act, 21 U.S.C. section 360bbb-3(b)(1), unless the authorization is terminated or revoked sooner. Performed at [facility], [place] [address], [place], [place] [address]   C difficile quick scan w PCR reflex     Status: Abnormal   Collection Time: 02/02/19 10:51 PM   Specimen: STOOL  Result Value Ref Range Status   C Diff antigen POSITIVE (A) NEGATIVE Final    Comment: CRITICAL RESULT CALLED TO, READ BACK BY AND VERIFIED WITH: C.MASEKO,RN AT 2330 BY L.PITT 02/03/2019    C Diff toxin POSITIVE (A) NEGATIVE Final   C Diff interpretation Toxin producing C. difficile detected.  Final    Comment: CRITICAL RESULT CALLED TO, READ BACK BY AND VERIFIED WITH: C.MASEKO,RN AT 2330 BY L.PITT 02/03/2019 Performed at [facility] [address] [place], [place] [address]     Coagulation Studies: No results for input(s): LABPROT, INR in the last 72 hours.  Urinalysis: No results for input(s): COLORURINE, LABSPEC, PHURINE, GLUCOSEU, HGBUR, BILIRUBINUR, KETONESUR, PROTEINUR, UROBILINOGEN, NITRITE, LEUKOCYTESUR in the last 72 hours.  Invalid input(s): APPERANCEUR     Imaging: Ir Fluoro Guide Cv Line Right  Result Date: 02/08/2019 CLINICAL DATA:  Renal failure and need for tunneled hemodialysis catheter. Current non tunneled temporary dialysis catheter via a right internal jugular vein placed on 01/18/2019. EXAM: TUNNELED CENTRAL VENOUS HEMODIALYSIS CATHETER PLACEMENT WITH ULTRASOUND AND FLUOROSCOPIC GUIDANCE ANESTHESIA/SEDATION: 1.0 mg IV Versed; 25 mcg IV Fentanyl. Total Moderate Sedation Time:   30 minutes. The patient's level of consciousness and physiologic status were continuously monitored during the procedure by Radiology nursing. MEDICATIONS: 2 g IV Ancef. FLUOROSCOPY TIME:  30 seconds.  1.5 mGy. PROCEDURE: The procedure, risks, benefits, and alternatives were explained to the patient's son. Questions regarding the procedure were encouraged and answered. The patient's son understands and consents to the procedure. A timeout was performed prior to initiating the procedure. The pre-existing non tunneled temporary dialysis catheter was removed and hemostasis obtained with manual compression. The right neck and chest were prepped with chlorhexidine in a sterile fashion, and a sterile drape was applied covering the operative field. Maximum barrier sterile technique with sterile gowns and gloves were used for the procedure. Local anesthesia was provided with 1% lidocaine. Ultrasound was used to confirm patency of the right internal jugular vein. After creating a small venotomy incision, a 21 gauge needle was advanced into the right internal jugular vein under  direct, real-time ultrasound guidance. Ultrasound image documentation was performed. After securing guidewire access, an 8 Fr dilator was placed. A J-wire was kinked to measure appropriate catheter length. A Palindrome tunneled hemodialysis catheter measuring 19 cm from tip to cuff was chosen for placement. This was tunneled in a retrograde fashion from the chest wall to the venotomy incision. At the venotomy,  serial dilatation was performed and a 16 Fr peel-away sheath was placed over a guidewire. The catheter was then placed through the sheath and the sheath removed. Final catheter positioning was confirmed and documented with a fluoroscopic spot image. The catheter was aspirated, flushed with saline, and injected with appropriate volume heparin dwells. The venotomy incision was closed with subcuticular 4-0 Vicryl. Dermabond was applied to the incision. The catheter exit site was secured with 0-Prolene retention sutures. COMPLICATIONS: None.  No pneumothorax. FINDINGS: After catheter placement, the tip lies at the SVC/RA junction. The catheter aspirates normally and is ready for immediate use. IMPRESSION: Placement of tunneled hemodialysis catheter via the right internal jugular vein after removal of the non tunneled dialysis catheter. The catheter tip lies at the SVC/RA junction. The catheter is ready for immediate use. Electronically Signed   By: Aletta Edouard M.D.   On: 02/08/2019 11:24   Ir US Guide Vasc Access Right  Result Date: 02/08/2019 CLINICAL DATA:  Renal failure and need for tunneled hemodialysis catheter. Current non tunneled temporary dialysis catheter via a right internal jugular vein placed on 01/18/2019. EXAM: TUNNELED CENTRAL VENOUS HEMODIALYSIS CATHETER PLACEMENT WITH ULTRASOUND AND FLUOROSCOPIC GUIDANCE ANESTHESIA/SEDATION: 1.0 mg IV Versed; 25 mcg IV Fentanyl. Total Moderate Sedation Time:   30 minutes. The patient's level of consciousness and physiologic status were continuously monitored during the procedure by Radiology nursing. MEDICATIONS: 2 g IV Ancef. FLUOROSCOPY TIME:  30 seconds.  1.5 mGy. PROCEDURE: The procedure, risks, benefits, and alternatives were explained to the patient's son. Questions regarding the procedure were encouraged and answered. The patient's son understands and consents to the procedure. A timeout was performed prior to initiating the procedure. The pre-existing  non tunneled temporary dialysis catheter was removed and hemostasis obtained with manual compression. The right neck and chest were prepped with chlorhexidine in a sterile fashion, and a sterile drape was applied covering the operative field. Maximum barrier sterile technique with sterile gowns and gloves were used for the procedure. Local anesthesia was provided with 1% lidocaine. Ultrasound was used to confirm patency of the right internal jugular vein. After creating a small venotomy incision, a 21 gauge needle was advanced into the right internal jugular vein under direct, real-time ultrasound guidance. Ultrasound image documentation was performed. After securing guidewire access, an 8 Fr dilator was placed. A J-wire was kinked to measure appropriate catheter length. A Palindrome tunneled hemodialysis catheter measuring 19 cm from tip to cuff was chosen for placement. This was tunneled in a retrograde fashion from the chest wall to the venotomy incision. At the venotomy, serial dilatation was performed and a 16 Fr peel-away sheath was placed over a guidewire. The catheter was then placed through the sheath and the sheath removed. Final catheter positioning was confirmed and documented with a fluoroscopic spot image. The catheter was aspirated, flushed with saline, and injected with appropriate volume heparin dwells. The venotomy incision was closed with subcuticular 4-0 Vicryl. Dermabond was applied to the incision. The catheter exit site was secured with 0-Prolene retention sutures. COMPLICATIONS: None.  No pneumothorax. FINDINGS: After catheter placement, the tip lies at the SVC/RA junction. The  catheter aspirates normally and is ready for immediate use. IMPRESSION: Placement of tunneled hemodialysis catheter via the right internal jugular vein after removal of the non tunneled dialysis catheter. The catheter tip lies at the SVC/RA junction. The catheter is ready for immediate use. Electronically Signed   By:  Aletta Edouard M.D.   On: 02/08/2019 11:24   Dg Chest Port 1 View  Result Date: 02/08/2019 CLINICAL DATA:  Congestive heart failure.  Shortness of breath. EXAM: PORTABLE CHEST 1 VIEW COMPARISON:  Single-view of the chest 01/15/2019. FINDINGS: The left IJ approach dialysis catheter has been removed. There is a new right IJ approach dialysis catheter with its tip near the superior cavoatrial junction. Tracheostomy tube is unchanged. Feeding tube has been removed. Moderate bilateral pleural effusions, pulmonary edema and cardiomegaly are seen. No pneumothorax. Atherosclerosis noted. IMPRESSION: Tip of new dialysis catheter projects at the superior cavoatrial junction. No pneumothorax. Pulmonary edema with moderate bilateral pleural effusions. Atherosclerosis. Electronically Signed   By: Inge Rise M.D.   On: 02/08/2019 12:14     Medications:       Assessment/ Plan:  78 y.o. female with a PMHx of congestive heart failure, rheumatoid arthritis, chronic kidney disease stage IV, hyperlipidemia, fibromyalgia, giant cell arteritis, hypertension, thoracic ascending aortic aneurysm, duodenal ulcers, severe diverticulosis, coronary artery disease, hypothyroidism, who was admitted to [facility] on 01/01/2019 for ongoing treatment of acute respiratory failure, generalized debility, and severe renal dysfunction.   1.  Acute renal failure/chronic kidney disease stage IV.  Patient seen and evaluated during dialysis treatment.  Tolerating well.  Ultrafiltration target 2 kg.  2.  Acute respiratory failure.  Patient made DNR.  Continue ventilatory support for now.  3.  Anemia chronic kidney disease.  Hemoglobin up to 8.5 and rising.  Continue to monitor.  Previously required blood transfusion.   LOS: 0 Sherlin Sonier 10/21/20208:27 AM

## 2019-02-11 DIAGNOSIS — J159 Unspecified bacterial pneumonia: Secondary | ICD-10-CM | POA: Diagnosis not present

## 2019-02-11 DIAGNOSIS — N184 Chronic kidney disease, stage 4 (severe): Secondary | ICD-10-CM | POA: Diagnosis not present

## 2019-02-11 DIAGNOSIS — K922 Gastrointestinal hemorrhage, unspecified: Secondary | ICD-10-CM | POA: Diagnosis not present

## 2019-02-11 DIAGNOSIS — J9621 Acute and chronic respiratory failure with hypoxia: Secondary | ICD-10-CM | POA: Diagnosis not present

## 2019-02-11 NOTE — Progress Notes (Signed)
Pulmonary Critical Care Medicine [place]   PULMONARY CRITICAL CARE SERVICE  PROGRESS NOTE  Date of Service: 02/11/2019  Stephanie Stark  [id number]  DOB: December 11, 1940   DOA: 01/22/2019  Referring Physician: Merton Border, MD  HPI: Stephanie Stark is a 78 y.o. female seen for follow up of Acute on Chronic Respiratory Failure.  Patient currently is on full support on the ventilator right now is on assist control mode has not been tolerating weaning  Medications: Reviewed on Rounds  Physical Exam:  Vitals: Temperature 96.7 pulse 81 respiratory rate 33 blood pressure 143/64 saturations 99%  Ventilator Settings currently is on assist control FiO2 28% tidal volume 337 with a PEEP of 5  . General: Comfortable at this time . Eyes: Grossly normal lids, irises & conjunctiva . ENT: grossly tongue is normal . Neck: no obvious mass . Cardiovascular: S1 S2 normal no gallop . Respiratory: No rhonchi no rales are noted at this time . Abdomen: soft . Skin: no rash seen on limited exam . Musculoskeletal: not rigid . Psychiatric:unable to assess . Neurologic: no seizure no involuntary movements         Lab Data:   Basic Metabolic Panel: Recent Labs  Lab 02/04/19 0847 02/05/19 0619 02/08/19 0837 02/10/19 0549  NA 136 132* 128* 129*  K 3.5 3.5 3.9 3.8  CL 100 98 93* 93*  CO2 29 26 26 26   GLUCOSE 91 106* 104* 128*  BUN 15 25* 37* 36*  CREATININE 1.50* 2.02* 2.27* 2.24*  CALCIUM 8.8* 8.8* 9.3 9.9  MG 1.9  --   --   --   PHOS 2.0* 2.0* 1.8* 1.5*    ABG: No results for input(s): PHART, PCO2ART, PO2ART, HCO3, O2SAT in the last 168 hours.  Liver Function Tests: Recent Labs  Lab 02/04/19 0847 02/05/19 0619 02/08/19 0837 02/10/19 0549  ALBUMIN 2.3* 2.1* 2.1* 2.4*   No results for input(s): LIPASE, AMYLASE in the last 168 hours. No results for input(s): AMMONIA in the last 168 hours.  CBC: Recent Labs  Lab 02/04/19 0847 02/05/19 0619 02/08/19 0837  02/10/19 0549  WBC 6.8 8.0 5.3 8.6  HGB 6.8* 8.1* 8.4* 8.5*  HCT 21.1* 24.9* 25.3* 26.4*  MCV 91.7 91.5 90.7 91.7  PLT 133* 120* 124* 154    Cardiac Enzymes: No results for input(s): CKTOTAL, CKMB, CKMBINDEX, TROPONINI in the last 168 hours.  BNP (last 3 results) No results for input(s): BNP in the last 8760 hours.  ProBNP (last 3 results) No results for input(s): PROBNP in the last 8760 hours.  Radiological Exams: No results found.  Assessment/Plan Active Problems:   Acute on chronic respiratory failure with hypoxia (HCC)   Chronic kidney disease, stage IV (severe) (HCC)   Acute lower GI bleeding   Bacterial lobar pneumonia   1. Acute on chronic respiratory failure with hypoxia we will continue with full support on the ventilator patient is being dialyzed once the fluid status is improved we will have respiratory therapy reassess the possibility of weaning 2. Chronic kidney disease stage IV 3. Acute GI bleed no active bleeding 4. Bacterial pneumonia treated seems to be improving clinically   I have personally seen and evaluated the patient, evaluated laboratory and imaging results, formulated the assessment and plan and placed orders. The Patient requires high complexity decision making for assessment and support.  Case was discussed on Rounds with the Respiratory Therapy Staff  Allyne Gee, MD [facility] Pulmonary Critical Care Medicine Sleep Medicine

## 2019-02-12 DIAGNOSIS — K922 Gastrointestinal hemorrhage, unspecified: Secondary | ICD-10-CM | POA: Diagnosis not present

## 2019-02-12 DIAGNOSIS — J159 Unspecified bacterial pneumonia: Secondary | ICD-10-CM | POA: Diagnosis not present

## 2019-02-12 DIAGNOSIS — J9621 Acute and chronic respiratory failure with hypoxia: Secondary | ICD-10-CM | POA: Diagnosis not present

## 2019-02-12 DIAGNOSIS — N184 Chronic kidney disease, stage 4 (severe): Secondary | ICD-10-CM | POA: Diagnosis not present

## 2019-02-12 LAB — RENAL FUNCTION PANEL
Albumin: 2.4 g/dL — ABNORMAL LOW (ref 3.5–5.0)
Anion gap: 9 (ref 5–15)
BUN: 38 mg/dL — ABNORMAL HIGH (ref 8–23)
CO2: 25 mmol/L (ref 22–32)
Calcium: 9.7 mg/dL (ref 8.9–10.3)
Chloride: 94 mmol/L — ABNORMAL LOW (ref 98–111)
Creatinine, Ser: 1.9 mg/dL — ABNORMAL HIGH (ref 0.44–1.00)
GFR calc Af Amer: 29 mL/min — ABNORMAL LOW (ref >60)
GFR calc non Af Amer: 25 mL/min — ABNORMAL LOW (ref >60)
Glucose, Bld: 114 mg/dL — ABNORMAL HIGH (ref 70–99)
Phosphorus: 1.5 mg/dL — ABNORMAL LOW (ref 2.5–4.6)
Potassium: 4.3 mmol/L (ref 3.5–5.1)
Sodium: 128 mmol/L — ABNORMAL LOW (ref 135–145)

## 2019-02-12 LAB — CBC
HCT: 24.5 % — ABNORMAL LOW (ref 36.0–46.0)
Hemoglobin: 8 g/dL — ABNORMAL LOW (ref 12.0–15.0)
MCH: 29.7 pg (ref 26.0–34.0)
MCHC: 32.7 g/dL (ref 30.0–36.0)
MCV: 91.1 fL (ref 80.0–100.0)
Platelets: 147 10*3/uL — ABNORMAL LOW (ref 150–400)
RBC: 2.69 MIL/uL — ABNORMAL LOW (ref 3.87–5.11)
RDW: 16.2 % — ABNORMAL HIGH (ref 11.5–15.5)
WBC: 7.7 10*3/uL (ref 4.0–10.5)
nRBC: 0 % (ref 0.0–0.2)

## 2019-02-12 NOTE — Progress Notes (Signed)
[facility]  ROUNDING NOTE   Subjective:  Patient seen and evaluated during dialysis treatment. Appears to be tolerating well currently. Having secretions from her tracheostomy.   Objective:  Vital signs in last 24 hours:  Temperature 96.7 pulse 84 respirations 31 blood pressure 127/62   Physical Exam: General: Critically ill-appearing  Head: Normocephalic/atraumatic  Eyes: Anicteric  Neck: Supple, trachea midline  Lungs:  Scattered rhonchi rales, vent assisted  Heart: S1S2 no rubs  Abdomen:  Soft, bowel sounds present, PEG tube noted  Extremities: 1+ peripheral edema.  Neurologic: Awake, follows simple commands  Skin: Scattered ecchymoses  Access: Temporary right internal jugular dialysis catheter    Basic Metabolic Panel: Recent Labs  Lab 02/08/19 0837 02/10/19 0549 02/12/19 0629  NA 128* 129* 128*  K 3.9 3.8 4.3  CL 93* 93* 94*  CO2 26 26 25   GLUCOSE 104* 128* 114*  BUN 37* 36* 38*  CREATININE 2.27* 2.24* 1.90*  CALCIUM 9.3 9.9 9.7  PHOS 1.8* 1.5* 1.5*    Liver Function Tests: Recent Labs  Lab 02/08/19 0837 02/10/19 0549 02/12/19 0629  ALBUMIN 2.1* 2.4* 2.4*   No results for input(s): LIPASE, AMYLASE in the last 168 hours. No results for input(s): AMMONIA in the last 168 hours.  CBC: Recent Labs  Lab 02/08/19 0837 02/10/19 0549 02/12/19 0629  WBC 5.3 8.6 7.7  HGB 8.4* 8.5* 8.0*  HCT 25.3* 26.4* 24.5*  MCV 90.7 91.7 91.1  PLT 124* 154 147*    Cardiac Enzymes: No results for input(s): CKTOTAL, CKMB, CKMBINDEX, TROPONINI in the last 168 hours.  BNP: Invalid input(s): POCBNP  CBG: No results for input(s): GLUCAP in the last 168 hours.  Microbiology: Results for orders placed or performed during the hospital encounter of 01/22/19  SARS Coronavirus 2 [facility] order, Performed in [facility] hospital lab) Nasopharyngeal Nasopharyngeal Swab     Status: None   Collection Time: 01/27/19  2:40 PM   Specimen: Nasopharyngeal Swab   Result Value Ref Range Status   SARS Coronavirus 2 NEGATIVE NEGATIVE Final    Comment: (NOTE) If result is NEGATIVE SARS-CoV-2 target nucleic acids are NOT DETECTED. The SARS-CoV-2 RNA is generally detectable in upper and lower  respiratory specimens during the acute phase of infection. The lowest  concentration of SARS-CoV-2 viral copies this assay can detect is 250  copies / mL. A negative result does not preclude SARS-CoV-2 infection  and should not be used as the sole basis for treatment or other  patient management decisions.  A negative result may occur with  improper specimen collection / handling, submission of specimen other  than nasopharyngeal swab, presence of viral mutation(s) within the  areas targeted by this assay, and inadequate number of viral copies  (<250 copies / mL). A negative result must be combined with clinical  observations, patient history, and epidemiological information. If result is POSITIVE SARS-CoV-2 target nucleic acids are DETECTED. The SARS-CoV-2 RNA is generally detectable in upper and lower  respiratory specimens dur ing the acute phase of infection.  Positive  results are indicative of active infection with SARS-CoV-2.  Clinical  correlation with patient history and other diagnostic information is  necessary to determine patient infection status.  Positive results do  not rule out bacterial infection or co-infection with other viruses. If result is PRESUMPTIVE POSTIVE SARS-CoV-2 nucleic acids MAY BE PRESENT.   A presumptive positive result was obtained on the submitted specimen  and confirmed on repeat testing.  While 2019 novel coronavirus  (  SARS-CoV-2) nucleic acids may be present in the submitted sample  additional confirmatory testing may be necessary for epidemiological  and / or clinical management purposes  to differentiate between  SARS-CoV-2 and other Sarbecovirus currently known to infect humans.  If clinically indicated additional  testing with an alternate test  methodology [phone]) is advised. The SARS-CoV-2 RNA is generally  detectable in upper and lower respiratory sp ecimens during the acute  phase of infection. The expected result is Negative. Fact Sheet for Patients:  StrictlyIdeas.no Fact Sheet for Healthcare Providers: BankingDealers.co.za This test is not yet approved or cleared by the [place] FDA and has been authorized for detection and/or diagnosis of SARS-CoV-2 by FDA under an Emergency Use Authorization (EUA).  This EUA will remain in effect (meaning this test can be used) for the duration of the COVID-19 declaration under Section 564(b)(1) of the Act, 21 U.S.C. section 360bbb-3(b)(1), unless the authorization is terminated or revoked sooner. Performed at [facility], [place] [address], [place], [place] [address]   C difficile quick scan w PCR reflex     Status: Abnormal   Collection Time: 02/02/19 10:51 PM   Specimen: STOOL  Result Value Ref Range Status   C Diff antigen POSITIVE (A) NEGATIVE Final    Comment: CRITICAL RESULT CALLED TO, READ BACK BY AND VERIFIED WITH: C.MASEKO,RN AT 2330 BY L.PITT 02/03/2019    C Diff toxin POSITIVE (A) NEGATIVE Final   C Diff interpretation Toxin producing C. difficile detected.  Final    Comment: CRITICAL RESULT CALLED TO, READ BACK BY AND VERIFIED WITH: C.MASEKO,RN AT 2330 BY L.PITT 02/03/2019 Performed at [place] [address], [place], [place] [address]     Coagulation Studies: No results for input(s): LABPROT, INR in the last 72 hours.  Urinalysis: No results for input(s): COLORURINE, LABSPEC, PHURINE, GLUCOSEU, HGBUR, BILIRUBINUR, KETONESUR, PROTEINUR, UROBILINOGEN, NITRITE, LEUKOCYTESUR in the last 72 hours.  Invalid input(s): APPERANCEUR    Imaging: No results found.   Medications:       Assessment/ Plan:  78 y.o. female with a PMHx of congestive heart failure,  rheumatoid arthritis, chronic kidney disease stage IV, hyperlipidemia, fibromyalgia, giant cell arteritis, hypertension, thoracic ascending aortic aneurysm, duodenal ulcers, severe diverticulosis, coronary artery disease, hypothyroidism, who was admitted to [facility] on 01/01/2019 for ongoing treatment of acute respiratory failure, generalized debility, and severe renal dysfunction.   1.  Acute renal failure/chronic kidney disease stage IV.  Patient seen during dialysis treatment.  She appears to be tolerating this quite well today.  Ultrafiltration target 1.5 to 2 kg.  Thereafter next treatment on Monday.  2.  Acute respiratory failure.  Patient still on ventilator.  FiO2 28%.  Having secretions from the tracheostomy.  3.  Anemia chronic kidney disease.  Hemoglobin drifting down again and currently 8.0.  Required blood transfusion late last week.  Continue to monitor hemoglobin.  4.  Hypophosphatemia.  Phosphorus noted to be low at 1.5.  Will discuss with nutrition team.   LOS: 0 Stephanie Stark 10/23/20208:22 AM

## 2019-02-12 NOTE — Progress Notes (Signed)
Pulmonary Critical Care Medicine [place]   PULMONARY CRITICAL CARE SERVICE  PROGRESS NOTE  Date of Service: 02/12/2019  Stephanie Stark  [id number]  DOB: 03-Sep-1940   DOA: 01/22/2019  Referring Physician: Merton Border, MD  HPI: Stephanie Stark is a 78 y.o. female seen for follow up of Acute on Chronic Respiratory Failure.  Patient currently is on full support on assist control mode has been on 28% FiO2 with a PEEP of 5 this attempted weaning today and patient failed  Medications: Reviewed on Rounds  Physical Exam:  Vitals: Currently temperature is 96.7 pulse 84 respiratory rate 31 blood pressure 127/62 saturations 99%  Ventilator Settings mode of ventilation is assist control FiO2 28% tidal volume 400 PEEP 5  . General: Comfortable at this time . Eyes: Grossly normal lids, irises & conjunctiva . ENT: grossly tongue is normal . Neck: no obvious mass . Cardiovascular: S1 S2 normal no gallop . Respiratory: No rhonchi no rales are noted at this time . Abdomen: soft . Skin: no rash seen on limited exam . Musculoskeletal: not rigid . Psychiatric:unable to assess . Neurologic: no seizure no involuntary movements         Lab Data:   Basic Metabolic Panel: Recent Labs  Lab 02/08/19 0837 02/10/19 0549 02/12/19 0629  NA 128* 129* 128*  K 3.9 3.8 4.3  CL 93* 93* 94*  CO2 26 26 25   GLUCOSE 104* 128* 114*  BUN 37* 36* 38*  CREATININE 2.27* 2.24* 1.90*  CALCIUM 9.3 9.9 9.7  PHOS 1.8* 1.5* 1.5*    ABG: No results for input(s): PHART, PCO2ART, PO2ART, HCO3, O2SAT in the last 168 hours.  Liver Function Tests: Recent Labs  Lab 02/08/19 0837 02/10/19 0549 02/12/19 0629  ALBUMIN 2.1* 2.4* 2.4*   No results for input(s): LIPASE, AMYLASE in the last 168 hours. No results for input(s): AMMONIA in the last 168 hours.  CBC: Recent Labs  Lab 02/08/19 0837 02/10/19 0549 02/12/19 0629  WBC 5.3 8.6 7.7  HGB 8.4* 8.5* 8.0*  HCT 25.3* 26.4* 24.5*   MCV 90.7 91.7 91.1  PLT 124* 154 147*    Cardiac Enzymes: No results for input(s): CKTOTAL, CKMB, CKMBINDEX, TROPONINI in the last 168 hours.  BNP (last 3 results) No results for input(s): BNP in the last 8760 hours.  ProBNP (last 3 results) No results for input(s): PROBNP in the last 8760 hours.  Radiological Exams: No results found.  Assessment/Plan Active Problems:   Acute on chronic respiratory failure with hypoxia (HCC)   Chronic kidney disease, stage IV (severe) (HCC)   Acute lower GI bleeding   Bacterial lobar pneumonia   1. Acute on chronic respiratory failure with hypoxia plan is to continue with full vent support patient was attempted on a wean today failed 2. Chronic kidney disease stage IV we will continue with supportive care 3. Acute GI bleed no active bleeding 4. Bacterial pneumonia treated we will follow   I have personally seen and evaluated the patient, evaluated laboratory and imaging results, formulated the assessment and plan and placed orders. The Patient requires high complexity decision making for assessment and support.  Case was discussed on Rounds with the Respiratory Therapy Staff  Allyne Gee, MD [facility] Pulmonary Critical Care Medicine Sleep Medicine

## 2019-02-13 DIAGNOSIS — K922 Gastrointestinal hemorrhage, unspecified: Secondary | ICD-10-CM | POA: Diagnosis not present

## 2019-02-13 DIAGNOSIS — N184 Chronic kidney disease, stage 4 (severe): Secondary | ICD-10-CM | POA: Diagnosis not present

## 2019-02-13 DIAGNOSIS — J9621 Acute and chronic respiratory failure with hypoxia: Secondary | ICD-10-CM | POA: Diagnosis not present

## 2019-02-13 DIAGNOSIS — J159 Unspecified bacterial pneumonia: Secondary | ICD-10-CM | POA: Diagnosis not present

## 2019-02-13 NOTE — Progress Notes (Signed)
Pulmonary Critical Care Medicine [place]   PULMONARY CRITICAL CARE SERVICE  PROGRESS NOTE  Date of Service: 02/13/2019  Stephanie Stark  [name]  DOB: January 27, 1941   DOA: 01/22/2019  Referring Physician: Merton Border, MD  HPI: Stephanie Stark is a 78 y.o. female seen for follow up of Acute on Chronic Respiratory Failure.  Patient currently is on full support on the ventilator on assist control mode has not been tolerating weaning attempting  Medications: Reviewed on Rounds  Physical Exam:  Vitals: Temperature 96.5 pulse 94 respiratory 28 blood pressure 166/65 saturations 95%  Ventilator Settings mode of ventilation assist control FiO2 28% tidal volume 359 PEEP 5  . General: Comfortable at this time . Eyes: Grossly normal lids, irises & conjunctiva . ENT: grossly tongue is normal . Neck: no obvious mass . Cardiovascular: S1 S2 normal no gallop . Respiratory: No rhonchi no rales are noted at this time . Abdomen: soft . Skin: no rash seen on limited exam . Musculoskeletal: not rigid . Psychiatric:unable to assess . Neurologic: no seizure no involuntary movements         Lab Data:   Basic Metabolic Panel: Recent Labs  Lab 02/08/19 0837 02/10/19 0549 02/12/19 0629  NA 128* 129* 128*  K 3.9 3.8 4.3  CL 93* 93* 94*  CO2 26 26 25   GLUCOSE 104* 128* 114*  BUN 37* 36* 38*  CREATININE 2.27* 2.24* 1.90*  CALCIUM 9.3 9.9 9.7  PHOS 1.8* 1.5* 1.5*    ABG: No results for input(s): PHART, PCO2ART, PO2ART, HCO3, O2SAT in the last 168 hours.  Liver Function Tests: Recent Labs  Lab 02/08/19 0837 02/10/19 0549 02/12/19 0629  ALBUMIN 2.1* 2.4* 2.4*   No results for input(s): LIPASE, AMYLASE in the last 168 hours. No results for input(s): AMMONIA in the last 168 hours.  CBC: Recent Labs  Lab 02/08/19 0837 02/10/19 0549 02/12/19 0629  WBC 5.3 8.6 7.7  HGB 8.4* 8.5* 8.0*  HCT 25.3* 26.4* 24.5*  MCV 90.7 91.7 91.1  PLT 124* 154 147*     Cardiac Enzymes: No results for input(s): CKTOTAL, CKMB, CKMBINDEX, TROPONINI in the last 168 hours.  BNP (last 3 results) No results for input(s): BNP in the last 8760 hours.  ProBNP (last 3 results) No results for input(s): PROBNP in the last 8760 hours.  Radiological Exams: No results found.  Assessment/Plan Active Problems:   Acute on chronic respiratory failure with hypoxia (HCC)   Chronic kidney disease, stage IV (severe) (HCC)   Acute lower GI bleeding   Bacterial lobar pneumonia   1. Acute on chronic respiratory failure hypoxia plan is to continue with full support on assist control mode currently on 28% FiO2 with a PEEP of 5 2. Chronic kidney disease stage IV seen by nephrology 3. Acute GI bleed no active bleeding 4. Lobar pneumonia treated   I have personally seen and evaluated the patient, evaluated laboratory and imaging results, formulated the assessment and plan and placed orders. The Patient requires high complexity decision making for assessment and support.  Case was discussed on Rounds with the Respiratory Therapy Staff  Allyne Gee, MD [facility] Pulmonary Critical Care Medicine Sleep Medicine

## 2019-02-14 DIAGNOSIS — J159 Unspecified bacterial pneumonia: Secondary | ICD-10-CM | POA: Diagnosis not present

## 2019-02-14 DIAGNOSIS — K922 Gastrointestinal hemorrhage, unspecified: Secondary | ICD-10-CM | POA: Diagnosis not present

## 2019-02-14 DIAGNOSIS — J9621 Acute and chronic respiratory failure with hypoxia: Secondary | ICD-10-CM | POA: Diagnosis not present

## 2019-02-14 DIAGNOSIS — N184 Chronic kidney disease, stage 4 (severe): Secondary | ICD-10-CM | POA: Diagnosis not present

## 2019-02-14 NOTE — Progress Notes (Signed)
Pulmonary [place]   PULMONARY CRITICAL CARE SERVICE  PROGRESS NOTE  Date of Service: 02/14/2019  Stephanie Stark  [name]  DOB: [birth year]   DOA: 01/22/2019  Referring Physician: Merton Border, MD  HPI: Stephanie Stark is a 78 y.o. female seen for follow up of Acute on Chronic Respiratory Failure.  Patient currently is on full support on assist control mode currently on 28% FiO2 with a PEEP of 5  Medications: Reviewed on Rounds  Physical Exam:  Vitals: Temperature 97.5 pulse 75 respiratory rate 24 blood pressure 139/58 saturations 100%  Ventilator Settings currently on assist control full support patient has not been able to tolerate weaning right now is on FiO2 28% tidal volume 399 PEEP 5  . General: Comfortable at this time . Eyes: Grossly normal lids, irises & conjunctiva . ENT: grossly tongue is normal . Neck: no obvious mass . Cardiovascular: S1 S2 normal no gallop . Respiratory: Coarse rhonchi noted bilaterally . Abdomen: soft . Skin: no rash seen on limited exam . Musculoskeletal: not rigid . Psychiatric:unable to assess . Neurologic: no seizure no involuntary movements         Lab Data:   Basic Metabolic Panel: Recent Labs  Lab 02/08/19 0837 02/10/19 0549 02/12/19 0629  NA 128* 129* 128*  K 3.9 3.8 4.3  CL 93* 93* 94*  CO2 26 26 25   GLUCOSE 104* 128* 114*  BUN 37* 36* 38*  CREATININE 2.27* 2.24* 1.90*  CALCIUM 9.3 9.9 9.7  PHOS 1.8* 1.5* 1.5*    ABG: No results for input(s): PHART, PCO2ART, PO2ART, HCO3, O2SAT in the last 168 hours.  Liver Function Tests: Recent Labs  Lab 02/08/19 0837 02/10/19 0549 02/12/19 0629  ALBUMIN 2.1* 2.4* 2.4*   No results for input(s): LIPASE, AMYLASE in the last 168 hours. No results for input(s): AMMONIA in the last 168 hours.  CBC: Recent Labs  Lab 02/08/19 0837 02/10/19 0549 02/12/19 0629  WBC 5.3 8.6 7.7  HGB 8.4* 8.5* 8.0*  HCT 25.3* 26.4* 24.5*  MCV  90.7 91.7 91.1  PLT 124* 154 147*    Cardiac Enzymes: No results for input(s): CKTOTAL, CKMB, CKMBINDEX, TROPONINI in the last 168 hours.  BNP (last 3 results) No results for input(s): BNP in the last 8760 hours.  ProBNP (last 3 results) No results for input(s): PROBNP in the last 8760 hours.  Radiological Exams: No results found.  Assessment/Plan Active Problems:   Acute on chronic respiratory failure with hypoxia (HCC)   Chronic kidney disease, stage IV (severe) (HCC)   Acute lower GI bleeding   Bacterial lobar pneumonia   1. Acute on chronic respiratory failure with hypoxia we will continue with full support on assist control titrate oxygen down as tolerated continue pulmonary toilet 2. Chronic kidney disease stage IV at baseline continue to follow 3. Acute GI bleed no active bleeding 4. Bacterial pneumonia treated   I have personally seen and evaluated the patient, evaluated laboratory and imaging results, formulated the assessment and plan and placed orders. The Patient requires high complexity decision making for assessment and support.  Case was discussed on Rounds with the Respiratory Therapy Staff  Allyne Gee, MD [facility] Pulmonary Critical Care Medicine Sleep Medicine

## 2019-02-15 ENCOUNTER — Other Ambulatory Visit (HOSPITAL_COMMUNITY): Payer: Medicare Other

## 2019-02-15 DIAGNOSIS — N184 Chronic kidney disease, stage 4 (severe): Secondary | ICD-10-CM | POA: Diagnosis not present

## 2019-02-15 DIAGNOSIS — J159 Unspecified bacterial pneumonia: Secondary | ICD-10-CM | POA: Diagnosis not present

## 2019-02-15 DIAGNOSIS — K922 Gastrointestinal hemorrhage, unspecified: Secondary | ICD-10-CM | POA: Diagnosis not present

## 2019-02-15 DIAGNOSIS — J9621 Acute and chronic respiratory failure with hypoxia: Secondary | ICD-10-CM | POA: Diagnosis not present

## 2019-02-15 LAB — CBC
HCT: 22.1 % — ABNORMAL LOW (ref 36.0–46.0)
Hemoglobin: 7.3 g/dL — ABNORMAL LOW (ref 12.0–15.0)
MCH: 30.7 pg (ref 26.0–34.0)
MCHC: 33 g/dL (ref 30.0–36.0)
MCV: 92.9 fL (ref 80.0–100.0)
Platelets: 136 10*3/uL — ABNORMAL LOW (ref 150–400)
RBC: 2.38 MIL/uL — ABNORMAL LOW (ref 3.87–5.11)
RDW: 16.9 % — ABNORMAL HIGH (ref 11.5–15.5)
WBC: 8 10*3/uL (ref 4.0–10.5)
nRBC: 0 % (ref 0.0–0.2)

## 2019-02-15 LAB — RENAL FUNCTION PANEL
Albumin: 2.3 g/dL — ABNORMAL LOW (ref 3.5–5.0)
Anion gap: 7 (ref 5–15)
BUN: 51 mg/dL — ABNORMAL HIGH (ref 8–23)
CO2: 29 mmol/L (ref 22–32)
Calcium: 9.9 mg/dL (ref 8.9–10.3)
Chloride: 91 mmol/L — ABNORMAL LOW (ref 98–111)
Creatinine, Ser: 2.36 mg/dL — ABNORMAL HIGH (ref 0.44–1.00)
GFR calc Af Amer: 22 mL/min — ABNORMAL LOW (ref >60)
GFR calc non Af Amer: 19 mL/min — ABNORMAL LOW (ref >60)
Glucose, Bld: 88 mg/dL (ref 70–99)
Phosphorus: 1.6 mg/dL — ABNORMAL LOW (ref 2.5–4.6)
Potassium: 4.2 mmol/L (ref 3.5–5.1)
Sodium: 127 mmol/L — ABNORMAL LOW (ref 135–145)

## 2019-02-15 NOTE — Progress Notes (Signed)
Pulmonary Critical Care Medicine [place]   PULMONARY CRITICAL CARE SERVICE  PROGRESS NOTE  Date of Service: 02/15/2019  Stephanie Stark  [id number]  DOB: 1941-02-07   DOA: 01/22/2019  Referring Physician: Merton Border, MD  HPI: Stephanie Stark is a 78 y.o. female seen for follow up of Acute on Chronic Respiratory Failure.  Patient currently is on the ventilator patient has been on assist control mode currently is on 28% FiO2 secretions are fair to moderate  Medications: Reviewed on Rounds  Physical Exam:  Vitals: Temperature 97.4 pulse 90 respiratory rate 20 blood pressure 148/60 saturations 98%  Ventilator Settings mode ventilation assist control FiO2 28% tidal volume 330 PEEP 5  . General: Comfortable at this time . Eyes: Grossly normal lids, irises & conjunctiva . ENT: grossly tongue is normal . Neck: no obvious mass . Cardiovascular: S1 S2 normal no gallop . Respiratory: No rhonchi no rales are noted at this time . Abdomen: soft . Skin: no rash seen on limited exam . Musculoskeletal: not rigid . Psychiatric:unable to assess . Neurologic: no seizure no involuntary movements         Lab Data:   Basic Metabolic Panel: Recent Labs  Lab 02/10/19 0549 02/12/19 0629 02/15/19 0500  NA 129* 128* 127*  K 3.8 4.3 4.2  CL 93* 94* 91*  CO2 26 25 29   GLUCOSE 128* 114* 88  BUN 36* 38* 51*  CREATININE 2.24* 1.90* 2.36*  CALCIUM 9.9 9.7 9.9  PHOS 1.5* 1.5* 1.6*    ABG: No results for input(s): PHART, PCO2ART, PO2ART, HCO3, O2SAT in the last 168 hours.  Liver Function Tests: Recent Labs  Lab 02/10/19 0549 02/12/19 0629 02/15/19 0500  ALBUMIN 2.4* 2.4* 2.3*   No results for input(s): LIPASE, AMYLASE in the last 168 hours. No results for input(s): AMMONIA in the last 168 hours.  CBC: Recent Labs  Lab 02/10/19 0549 02/12/19 0629 02/15/19 0500  WBC 8.6 7.7 8.0  HGB 8.5* 8.0* 7.3*  HCT 26.4* 24.5* 22.1*  MCV 91.7 91.1 92.9  PLT 154  147* 136*    Cardiac Enzymes: No results for input(s): CKTOTAL, CKMB, CKMBINDEX, TROPONINI in the last 168 hours.  BNP (last 3 results) No results for input(s): BNP in the last 8760 hours.  ProBNP (last 3 results) No results for input(s): PROBNP in the last 8760 hours.  Radiological Exams: Dg Chest Port 1 View  Result Date: 02/15/2019 CLINICAL DATA:  Follow-up pneumonia. EXAM: PORTABLE CHEST 1 VIEW COMPARISON:  02/08/2019 FINDINGS: The tracheostomy tube is stable. The right IJ dialysis catheter is stable. The cardiac silhouette, mediastinal and hilar contours are unchanged. Stable tortuosity and calcification of the thoracic aorta. Persistent underlying chronic lung changes with superimposed patchy infiltrates. Persistent small bilateral pleural effusions. IMPRESSION: 1. Stable support apparatus. 2. Patchy bilateral infiltrates and small effusions. Electronically Signed   By: Marijo Sanes M.D.   On: 02/15/2019 06:10    Assessment/Plan Active Problems:   Acute on chronic respiratory failure with hypoxia (HCC)   Chronic kidney disease, stage IV (severe) (HCC)   Acute lower GI bleeding   Bacterial lobar pneumonia   1. Acute on chronic respiratory failure with hypoxia continue with full support on the ventilator patient has been failing attempts at weaning last chest x-ray still showing significant infiltrates 2. Bacterial pneumonia treated we will continue with supportive care chest x-ray showed improvement 3. Chronic kidney disease stage IV patient is at baseline 4. Acute lower GI bleed no active bleeding  I have personally seen and evaluated the patient, evaluated laboratory and imaging results, formulated the assessment and plan and placed orders. The Patient requires high complexity decision making for assessment and support.  Case was discussed on Rounds with the Respiratory Therapy Staff  Allyne Gee, MD [facility] Pulmonary Critical Care Medicine Sleep Medicine

## 2019-02-15 NOTE — Progress Notes (Signed)
[facility]  ROUNDING NOTE   Subjective:  Patient seen and evaluated at bedside during dialysis treatment. Tolerating well. Ultrafiltration target 2 kg today.   Objective:  Vital signs in last 24 hours:  Temperature 97.4 pulse 90 respirations 20 blood pressure 148/49   Physical Exam: General: Critically ill-appearing  Head: Normocephalic/atraumatic  Eyes: Anicteric  Neck: Supple, trachea midline  Lungs:  Scattered rhonchi rales, vent assisted  Heart: S1S2 no rubs  Abdomen:  Soft, bowel sounds present, PEG tube noted  Extremities: 1+ peripheral edema.  Neurologic: Awake, follows simple commands  Skin: Scattered ecchymoses  Access: Temporary right internal jugular dialysis catheter    Basic Metabolic Panel: Recent Labs  Lab 02/10/19 0549 02/12/19 0629  NA 129* 128*  K 3.8 4.3  CL 93* 94*  CO2 26 25  GLUCOSE 128* 114*  BUN 36* 38*  CREATININE 2.24* 1.90*  CALCIUM 9.9 9.7  PHOS 1.5* 1.5*    Liver Function Tests: Recent Labs  Lab 02/10/19 0549 02/12/19 0629  ALBUMIN 2.4* 2.4*   No results for input(s): LIPASE, AMYLASE in the last 168 hours. No results for input(s): AMMONIA in the last 168 hours.  CBC: Recent Labs  Lab 02/10/19 0549 02/12/19 0629  WBC 8.6 7.7  HGB 8.5* 8.0*  HCT 26.4* 24.5*  MCV 91.7 91.1  PLT 154 147*    Cardiac Enzymes: No results for input(s): CKTOTAL, CKMB, CKMBINDEX, TROPONINI in the last 168 hours.  BNP: Invalid input(s): POCBNP  CBG: No results for input(s): GLUCAP in the last 168 hours.  Microbiology: Results for orders placed or performed during the hospital encounter of 01/22/19  SARS Coronavirus 2 [facility] order, Performed in [facility] hospital lab) Nasopharyngeal Nasopharyngeal Swab     Status: None   Collection Time: 01/27/19  2:40 PM   Specimen: Nasopharyngeal Swab  Result Value Ref Range Status   SARS Coronavirus 2 NEGATIVE NEGATIVE Final    Comment: (NOTE) If result is NEGATIVE SARS-CoV-2  target nucleic acids are NOT DETECTED. The SARS-CoV-2 RNA is generally detectable in upper and lower  respiratory specimens during the acute phase of infection. The lowest  concentration of SARS-CoV-2 viral copies this assay can detect is 250  copies / mL. A negative result does not preclude SARS-CoV-2 infection  and should not be used as the sole basis for treatment or other  patient management decisions.  A negative result may occur with  improper specimen collection / handling, submission of specimen other  than nasopharyngeal swab, presence of viral mutation(s) within the  areas targeted by this assay, and inadequate number of viral copies  (<250 copies / mL). A negative result must be combined with clinical  observations, patient history, and epidemiological information. If result is POSITIVE SARS-CoV-2 target nucleic acids are DETECTED. The SARS-CoV-2 RNA is generally detectable in upper and lower  respiratory specimens dur ing the acute phase of infection.  Positive  results are indicative of active infection with SARS-CoV-2.  Clinical  correlation with patient history and other diagnostic information is  necessary to determine patient infection status.  Positive results do  not rule out bacterial infection or co-infection with other viruses. If result is PRESUMPTIVE POSTIVE SARS-CoV-2 nucleic acids MAY BE PRESENT.   A presumptive positive result was obtained on the submitted specimen  and confirmed on repeat testing.  While 2019 novel coronavirus  (SARS-CoV-2) nucleic acids may be present in the submitted sample  additional confirmatory testing may be necessary for epidemiological  and / or  clinical management purposes  to differentiate between  SARS-CoV-2 and other Sarbecovirus currently known to infect humans.  If clinically indicated additional testing with an alternate test  methodology [phone]) is advised. The SARS-CoV-2 RNA is generally  detectable in upper and lower  respiratory sp ecimens during the acute  phase of infection. The expected result is Negative. Fact Sheet for Patients:  StrictlyIdeas.no Fact Sheet for Healthcare Providers: BankingDealers.co.za This test is not yet approved or cleared by the [place] FDA and has been authorized for detection and/or diagnosis of SARS-CoV-2 by FDA under an Emergency Use Authorization (EUA).  This EUA will remain in effect (meaning this test can be used) for the duration of the COVID-19 declaration under Section 564(b)(1) of the Act, 21 U.S.C. section 360bbb-3(b)(1), unless the authorization is terminated or revoked sooner. Performed at [facility], [place] [address], [place], [place] [address]   C difficile quick scan w PCR reflex     Status: Abnormal   Collection Time: 02/02/19 10:51 PM   Specimen: STOOL  Result Value Ref Range Status   C Diff antigen POSITIVE (A) NEGATIVE Final    Comment: CRITICAL RESULT CALLED TO, READ BACK BY AND VERIFIED WITH: C.MASEKO,RN AT 2330 BY L.PITT 02/03/2019    C Diff toxin POSITIVE (A) NEGATIVE Final   C Diff interpretation Toxin producing C. difficile detected.  Final    Comment: CRITICAL RESULT CALLED TO, READ BACK BY AND VERIFIED WITH: C.MASEKO,RN AT 2330 BY L.PITT 02/03/2019 Performed at [facility] [address] [place], [place] [address]     Coagulation Studies: No results for input(s): LABPROT, INR in the last 72 hours.  Urinalysis: No results for input(s): COLORURINE, LABSPEC, PHURINE, GLUCOSEU, HGBUR, BILIRUBINUR, KETONESUR, PROTEINUR, UROBILINOGEN, NITRITE, LEUKOCYTESUR in the last 72 hours.  Invalid input(s): APPERANCEUR    Imaging: Dg Chest Port 1 View  Result Date: 02/15/2019 CLINICAL DATA:  Follow-up pneumonia. EXAM: PORTABLE CHEST 1 VIEW COMPARISON:  02/08/2019 FINDINGS: The tracheostomy tube is stable. The right IJ dialysis catheter is stable. The cardiac silhouette,  mediastinal and hilar contours are unchanged. Stable tortuosity and calcification of the thoracic aorta. Persistent underlying chronic lung changes with superimposed patchy infiltrates. Persistent small bilateral pleural effusions. IMPRESSION: 1. Stable support apparatus. 2. Patchy bilateral infiltrates and small effusions. Electronically Signed   By: Marijo Sanes M.D.   On: 02/15/2019 06:10     Medications:       Assessment/ Plan:  78 y.o. female with a PMHx of congestive heart failure, rheumatoid arthritis, chronic kidney disease stage IV, hyperlipidemia, fibromyalgia, giant cell arteritis, hypertension, thoracic ascending aortic aneurysm, duodenal ulcers, severe diverticulosis, coronary artery disease, hypothyroidism, who was admitted to [facility] on 01/01/2019 for ongoing treatment of acute respiratory failure, generalized debility, and severe renal dysfunction.   1.  Acute renal failure/chronic kidney disease stage IV.  Patient seen at bedside.  She is seen during dialysis treatment and tolerating well.  Ultrafiltration target 2 kg today.  2.  Acute respiratory failure.  Patient has been difficult to wean from the ventilator and remains on ventilatory support.  Weaning as per pulmonary/critical care.  3.  Anemia chronic kidney disease.  Hemoglobin currently 8.0.  Continue to monitor hemoglobin periodically.  4.  Hypophosphatemia.  Repeat serum phosphorus today.   LOS: 0 Stephanie Stark 10/26/20208:48 AM

## 2019-02-16 DIAGNOSIS — J9621 Acute and chronic respiratory failure with hypoxia: Secondary | ICD-10-CM | POA: Diagnosis not present

## 2019-02-16 DIAGNOSIS — J159 Unspecified bacterial pneumonia: Secondary | ICD-10-CM | POA: Diagnosis not present

## 2019-02-16 DIAGNOSIS — N184 Chronic kidney disease, stage 4 (severe): Secondary | ICD-10-CM | POA: Diagnosis not present

## 2019-02-16 DIAGNOSIS — K922 Gastrointestinal hemorrhage, unspecified: Secondary | ICD-10-CM | POA: Diagnosis not present

## 2019-02-16 NOTE — Progress Notes (Signed)
Pulmonary Critical Care Medicine [facility]   PULMONARY CRITICAL CARE SERVICE  PROGRESS NOTE  Date of Service: 02/16/2019  Stephanie Stark  [name]  DOB: 1941-03-22   DOA: 01/22/2019  Referring Physician: Merton Border, MD  HPI: Stephanie Stark is a 78 y.o. female seen for follow up of Acute on Chronic Respiratory Failure.  Patient at this time is on full support was attempted on pressure support but failed apparently now the family has decided to take the patient home on hospice  Medications: Reviewed on Rounds  Physical Exam:  Vitals: Temperature 96.9 pulse 70 respiratory 15 blood pressure 139/72 saturations 100%  Ventilator Settings mode ventilation assist control FiO2 28% tidal line 302 PEEP 5  . General: Comfortable at this time . Eyes: Grossly normal lids, irises & conjunctiva . ENT: grossly tongue is normal . Neck: no obvious mass . Cardiovascular: S1 S2 normal no gallop . Respiratory: No rhonchi no rales are noted at this time . Abdomen: soft . Skin: no rash seen on limited exam . Musculoskeletal: not rigid . Psychiatric:unable to assess . Neurologic: no seizure no involuntary movements         Lab Data:   Basic Metabolic Panel: Recent Labs  Lab 02/10/19 0549 02/12/19 0629 02/15/19 0500  NA 129* 128* 127*  K 3.8 4.3 4.2  CL 93* 94* 91*  CO2 26 25 29   GLUCOSE 128* 114* 88  BUN 36* 38* 51*  CREATININE 2.24* 1.90* 2.36*  CALCIUM 9.9 9.7 9.9  PHOS 1.5* 1.5* 1.6*    ABG: No results for input(s): PHART, PCO2ART, PO2ART, HCO3, O2SAT in the last 168 hours.  Liver Function Tests: Recent Labs  Lab 02/10/19 0549 02/12/19 0629 02/15/19 0500  ALBUMIN 2.4* 2.4* 2.3*   No results for input(s): LIPASE, AMYLASE in the last 168 hours. No results for input(s): AMMONIA in the last 168 hours.  CBC: Recent Labs  Lab 02/10/19 0549 02/12/19 0629 02/15/19 0500  WBC 8.6 7.7 8.0  HGB 8.5* 8.0* 7.3*  HCT 26.4* 24.5* 22.1*  MCV 91.7 91.1  92.9  PLT 154 147* 136*    Cardiac Enzymes: No results for input(s): CKTOTAL, CKMB, CKMBINDEX, TROPONINI in the last 168 hours.  BNP (last 3 results) No results for input(s): BNP in the last 8760 hours.  ProBNP (last 3 results) No results for input(s): PROBNP in the last 8760 hours.  Radiological Exams: Dg Chest Port 1 View  Result Date: 02/15/2019 CLINICAL DATA:  Follow-up pneumonia. EXAM: PORTABLE CHEST 1 VIEW COMPARISON:  02/08/2019 FINDINGS: The tracheostomy tube is stable. The right IJ dialysis catheter is stable. The cardiac silhouette, mediastinal and hilar contours are unchanged. Stable tortuosity and calcification of the thoracic aorta. Persistent underlying chronic lung changes with superimposed patchy infiltrates. Persistent small bilateral pleural effusions. IMPRESSION: 1. Stable support apparatus. 2. Patchy bilateral infiltrates and small effusions. Electronically Signed   By: Marijo Sanes M.D.   On: 02/15/2019 06:10    Assessment/Plan Active Problems:   Acute on chronic respiratory failure with hypoxia (HCC)   Chronic kidney disease, stage IV (severe) (HCC)   Acute lower GI bleeding   Bacterial lobar pneumonia   1. Acute on chronic respiratory failure hypoxia plan continue with full support on the ventilator and assist control mode currently is on 28% FiO2 with a PEEP of 5 2. Chronic kidney disease stage IV continue present management 3. Lower GI bleed no active bleeding 4. Lobar pneumonia treated   I have personally seen and evaluated the  patient, evaluated laboratory and imaging results, formulated the assessment and plan and placed orders. The Patient requires high complexity decision making for assessment and support.  Case was discussed on Rounds with the Respiratory Therapy Staff  Allyne Gee, MD [facility] Pulmonary Critical Care Medicine Sleep Medicine

## 2019-02-17 LAB — CBC
HCT: 20.9 % — ABNORMAL LOW (ref 36.0–46.0)
Hemoglobin: 6.6 g/dL — CL (ref 12.0–15.0)
MCH: 29.5 pg (ref 26.0–34.0)
MCHC: 31.6 g/dL (ref 30.0–36.0)
MCV: 93.3 fL (ref 80.0–100.0)
Platelets: 130 10*3/uL — ABNORMAL LOW (ref 150–400)
RBC: 2.24 MIL/uL — ABNORMAL LOW (ref 3.87–5.11)
RDW: 17.3 % — ABNORMAL HIGH (ref 11.5–15.5)
WBC: 6.3 10*3/uL (ref 4.0–10.5)
nRBC: 0 % (ref 0.0–0.2)

## 2019-02-17 LAB — RENAL FUNCTION PANEL
Albumin: 2.2 g/dL — ABNORMAL LOW (ref 3.5–5.0)
Anion gap: 7 (ref 5–15)
BUN: 38 mg/dL — ABNORMAL HIGH (ref 8–23)
CO2: 27 mmol/L (ref 22–32)
Calcium: 9.3 mg/dL (ref 8.9–10.3)
Chloride: 93 mmol/L — ABNORMAL LOW (ref 98–111)
Creatinine, Ser: 2.09 mg/dL — ABNORMAL HIGH (ref 0.44–1.00)
GFR calc Af Amer: 25 mL/min — ABNORMAL LOW (ref >60)
GFR calc non Af Amer: 22 mL/min — ABNORMAL LOW (ref >60)
Glucose, Bld: 118 mg/dL — ABNORMAL HIGH (ref 70–99)
Phosphorus: 1.5 mg/dL — ABNORMAL LOW (ref 2.5–4.6)
Potassium: 4.1 mmol/L (ref 3.5–5.1)
Sodium: 127 mmol/L — ABNORMAL LOW (ref 135–145)

## 2019-02-17 NOTE — Progress Notes (Signed)
[facility]  ROUNDING NOTE   Subjective:  Patient seen evaluated during dialysis treatment today. Ultrafiltration target 1.5 to 2 kg today.    Objective:  Vital signs in last 24 hours:  Temperature 98.3 pulse 91 respirations 21 blood pressure 144/56   Physical Exam: General: Critically ill-appearing  Head: Normocephalic/atraumatic  Eyes: Anicteric  Neck: Supple, trachea midline  Lungs:  Scattered rhonchi rales, vent assisted  Heart: S1S2 no rubs  Abdomen:  Soft, bowel sounds present, PEG tube noted  Extremities: 1+ peripheral edema.  Neurologic: Awake, follows simple commands  Skin: Scattered ecchymoses  Access: Temporary right internal jugular dialysis catheter    Basic Metabolic Panel: Recent Labs  Lab 02/12/19 0629 02/15/19 0500 02/04/2019 0640  NA 128* 127* 127*  K 4.3 4.2 4.1  CL 94* 91* 93*  CO2 25 29 27   GLUCOSE 114* 88 118*  BUN 38* 51* 38*  CREATININE 1.90* 2.36* 2.09*  CALCIUM 9.7 9.9 9.3  PHOS 1.5* 1.6* 1.5*    Liver Function Tests: Recent Labs  Lab 02/12/19 0629 02/15/19 0500 01/30/2019 0640  ALBUMIN 2.4* 2.3* 2.2*   No results for input(s): LIPASE, AMYLASE in the last 168 hours. No results for input(s): AMMONIA in the last 168 hours.  CBC: Recent Labs  Lab 02/12/19 0629 02/15/19 0500 02/05/2019 0640  WBC 7.7 8.0 6.3  HGB 8.0* 7.3* 6.6*  HCT 24.5* 22.1* 20.9*  MCV 91.1 92.9 93.3  PLT 147* 136* 130*    Cardiac Enzymes: No results for input(s): CKTOTAL, CKMB, CKMBINDEX, TROPONINI in the last 168 hours.  BNP: Invalid input(s): POCBNP  CBG: No results for input(s): GLUCAP in the last 168 hours.  Microbiology: Results for orders placed or performed during the hospital encounter of 01/22/19  SARS Coronavirus 2 [facility] order, Performed in [facility] hospital lab) Nasopharyngeal Nasopharyngeal Swab     Status: None   Collection Time: 01/27/19  2:40 PM   Specimen: Nasopharyngeal Swab  Result Value Ref Range Status    SARS Coronavirus 2 NEGATIVE NEGATIVE Final    Comment: (NOTE) If result is NEGATIVE SARS-CoV-2 target nucleic acids are NOT DETECTED. The SARS-CoV-2 RNA is generally detectable in upper and lower  respiratory specimens during the acute phase of infection. The lowest  concentration of SARS-CoV-2 viral copies this assay can detect is 250  copies / mL. A negative result does not preclude SARS-CoV-2 infection  and should not be used as the sole basis for treatment or other  patient management decisions.  A negative result may occur with  improper specimen collection / handling, submission of specimen other  than nasopharyngeal swab, presence of viral mutation(s) within the  areas targeted by this assay, and inadequate number of viral copies  (<250 copies / mL). A negative result must be combined with clinical  observations, patient history, and epidemiological information. If result is POSITIVE SARS-CoV-2 target nucleic acids are DETECTED. The SARS-CoV-2 RNA is generally detectable in upper and lower  respiratory specimens dur ing the acute phase of infection.  Positive  results are indicative of active infection with SARS-CoV-2.  Clinical  correlation with patient history and other diagnostic information is  necessary to determine patient infection status.  Positive results do  not rule out bacterial infection or co-infection with other viruses. If result is PRESUMPTIVE POSTIVE SARS-CoV-2 nucleic acids MAY BE PRESENT.   A presumptive positive result was obtained on the submitted specimen  and confirmed on repeat testing.  While 2019 novel coronavirus  (SARS-CoV-2) nucleic acids  may be present in the submitted sample  additional confirmatory testing may be necessary for epidemiological  and / or clinical management purposes  to differentiate between  SARS-CoV-2 and other Sarbecovirus currently known to infect humans.  If clinically indicated additional testing with an alternate test   methodology [phone]) is advised. The SARS-CoV-2 RNA is generally  detectable in upper and lower respiratory sp ecimens during the acute  phase of infection. The expected result is Negative. Fact Sheet for Patients:  StrictlyIdeas.no Fact Sheet for Healthcare Providers: BankingDealers.co.za This test is not yet approved or cleared by the [place] FDA and has been authorized for detection and/or diagnosis of SARS-CoV-2 by FDA under an Emergency Use Authorization (EUA).  This EUA will remain in effect (meaning this test can be used) for the duration of the COVID-19 declaration under Section 564(b)(1) of the Act, 21 U.S.C. section 360bbb-3(b)(1), unless the authorization is terminated or revoked sooner. Performed at [facility], [place] [address], [place], [place] [address]   C difficile quick scan w PCR reflex     Status: Abnormal   Collection Time: 02/02/19 10:51 PM   Specimen: STOOL  Result Value Ref Range Status   C Diff antigen POSITIVE (A) NEGATIVE Final    Comment: CRITICAL RESULT CALLED TO, READ BACK BY AND VERIFIED WITH: C.MASEKO,RN AT 2330 BY L.PITT 02/03/2019    C Diff toxin POSITIVE (A) NEGATIVE Final   C Diff interpretation Toxin producing C. difficile detected.  Final    Comment: CRITICAL RESULT CALLED TO, READ BACK BY AND VERIFIED WITH: C.MASEKO,RN AT 2330 BY L.PITT 02/03/2019 Performed at [facility] [address], [place], [place] [address]     Coagulation Studies: No results for input(s): LABPROT, INR in the last 72 hours.  Urinalysis: No results for input(s): COLORURINE, LABSPEC, PHURINE, GLUCOSEU, HGBUR, BILIRUBINUR, KETONESUR, PROTEINUR, UROBILINOGEN, NITRITE, LEUKOCYTESUR in the last 72 hours.  Invalid input(s): APPERANCEUR    Imaging: No results found.   Medications:       Assessment/ Plan:  78 y.o. female with a PMHx of congestive heart failure, rheumatoid arthritis, chronic  kidney disease stage IV, hyperlipidemia, fibromyalgia, giant cell arteritis, hypertension, thoracic ascending aortic aneurysm, duodenal ulcers, severe diverticulosis, coronary artery disease, hypothyroidism, who was admitted to [facility] on 01/01/2019 for ongoing treatment of acute respiratory failure, generalized debility, and severe renal dysfunction.   1.  Acute renal failure/chronic kidney disease stage IV.  Patient seen and evaluated during dialysis treatment.  Ultrafiltration target 1.5 to 2 kg today.  It appears family considering comfort care for Friday.  2.  Acute respiratory failure.  Patient still on the ventilator and FiO2 currently 28% with a PEEP of 5.  Has been difficult to wean from the ventilator and patient's family considering comfort care.  3.  Anemia chronic kidney disease.  Hemoglobin low at 6.6.  However family considering comfort care and was offered transfusion but they declined.  4.  Hypophosphatemia.  Phosphorus remains low at 1.5 but no indication to replete as family opting for comfort care.   LOS: 0 Stephanie Stark 10/28/202010:45 AM

## 2019-02-21 DEATH — deceased

## 2021-07-25 IMAGING — DX DG CHEST 1V PORT
1 series · 1 of 1 positions shown · non-contrast
Comparison: No priors.

CLINICAL DATA: 78-year-old female with history of respiratory
failure.

EXAM:
PORTABLE CHEST 1 VIEW

[chest ap]
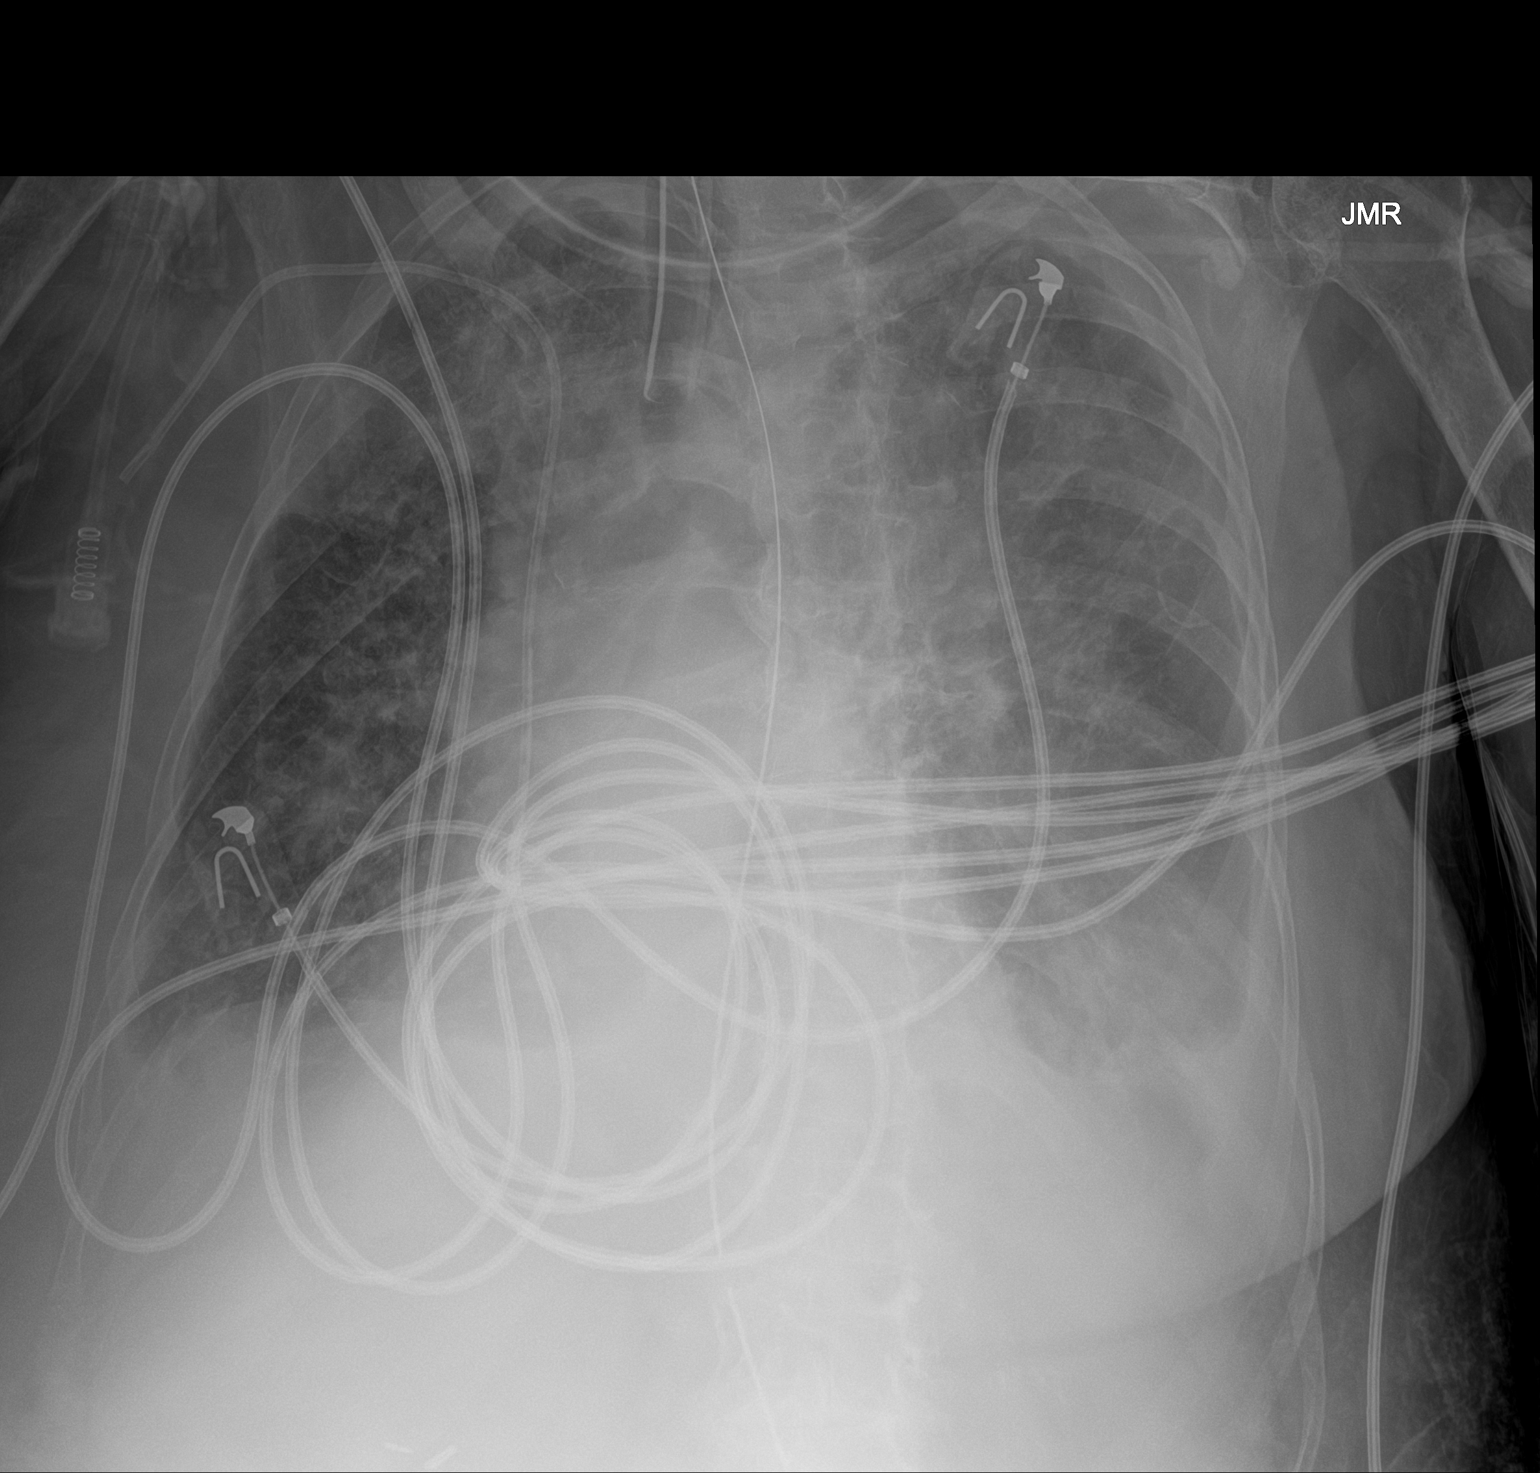

[1 of 1 positions shown; findings below may reference images not displayed]

FINDINGS: An endotracheal tube is in place with tip 3.2 cm above the carina. A
nasogastric tube is seen extending into the stomach, however, the
tip of the nasogastric tube extends below the lower margin of the
image. Patchy multifocal interstitial and airspace disease
throughout the lungs bilaterally, with relative sparing in the right
lung base. Moderate left and small right pleural effusions. Heart
size is borderline enlarged. The patient is rotated to the right on
today's exam, resulting in distortion of the mediastinal contours
and reduced diagnostic sensitivity and specificity for mediastinal
pathology.
IMPRESSION: 1. Support apparatus, as above.
2. The appearance the chest suggests severe multilobar pneumonia.
3. Moderate left and small right pleural effusions.

## 2021-07-25 IMAGING — DX DG ABD PORTABLE 1V
1 series · 1 of 1 positions shown · non-contrast
Comparison: 01/01/2019

CLINICAL DATA: NG tube placement.

EXAM:
PORTABLE ABDOMEN - 1 VIEW

[abdomen kub]
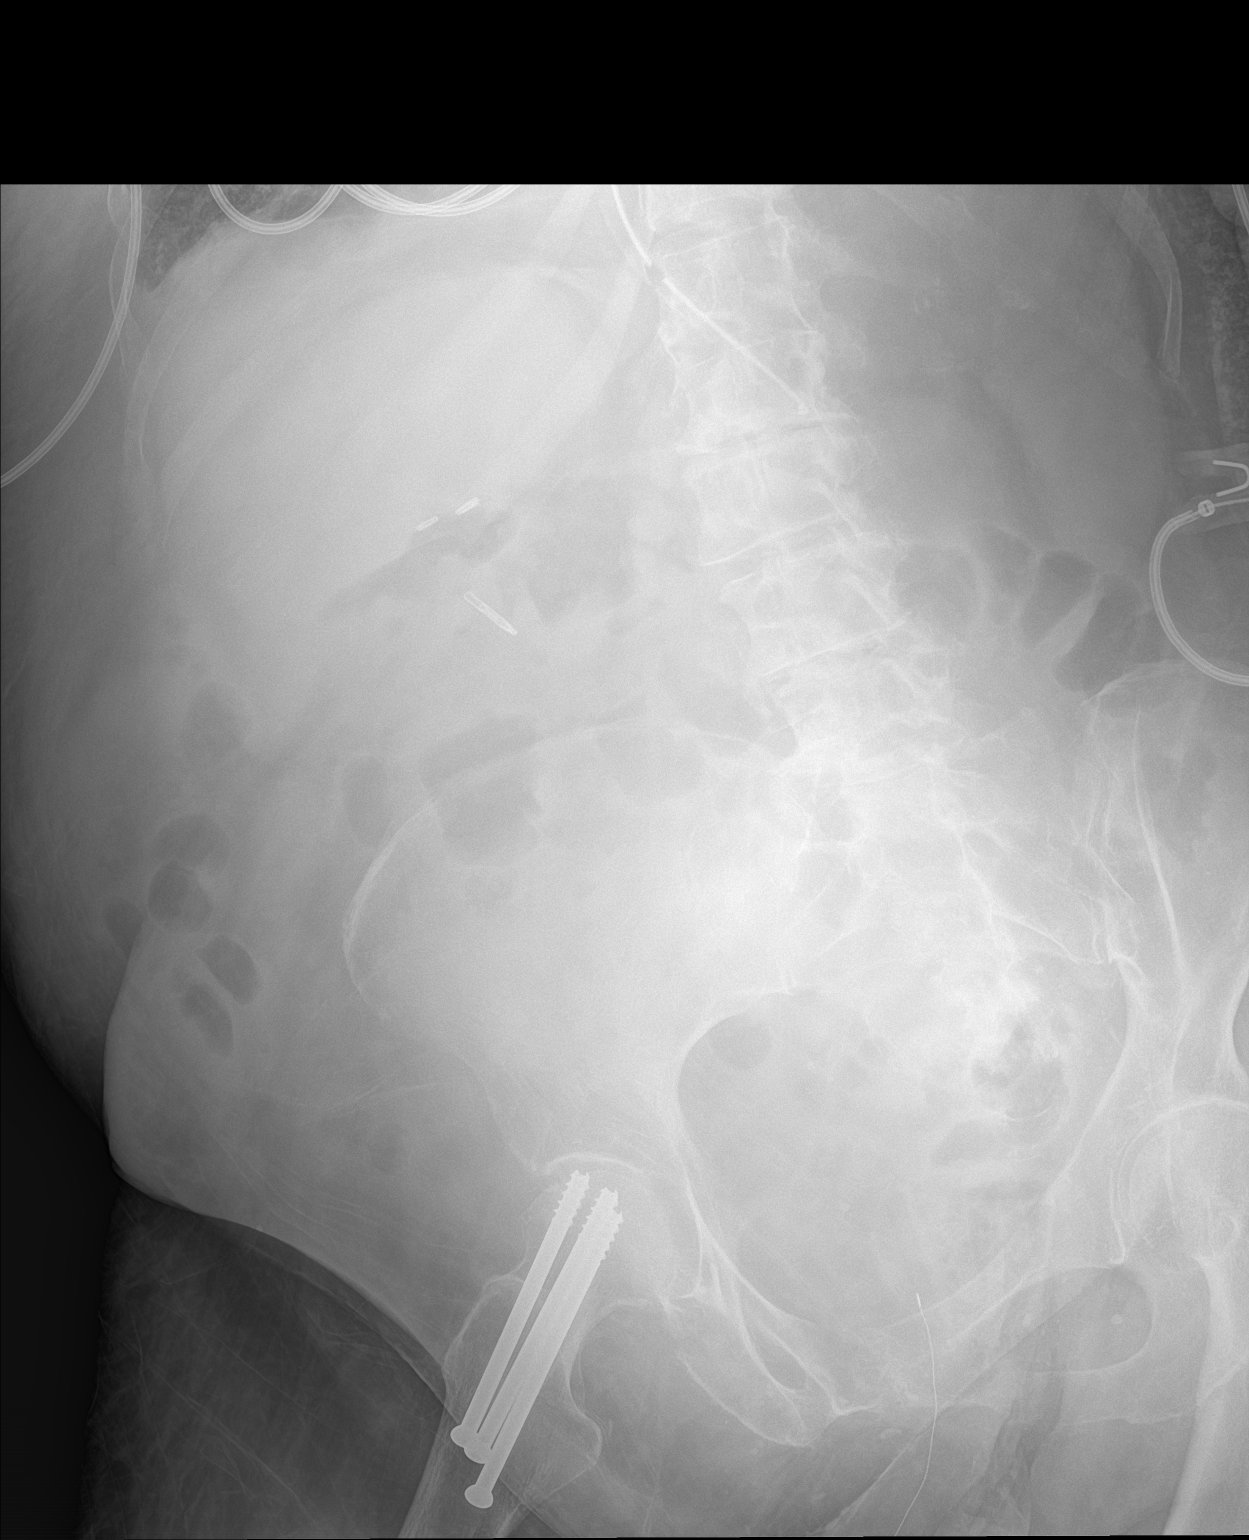

[1 of 1 positions shown; findings below may reference images not displayed]

FINDINGS: Patient rotated to the right. Nasogastric tube is present with tip
over the stomach just left of midline as the side port is in the
region of the gastroesophageal junction. Bowel gas pattern is
nonobstructive. Remainder of the exam is unchanged.
IMPRESSION: Nonobstructive bowel gas pattern.

Nasogastric tube with tip over the stomach in the left upper
quadrant and side-port at the level of the gastroesophageal
junction.

## 2021-07-27 IMAGING — XA IR FLUORO GUIDE CV LINE*L*
1 series · 1 of 1 positions shown · non-contrast
Comparison: none

CLINICAL DATA: Chronic kidney disease stage 4, needs access for
hemodialysis

EXAM:
EXAM
RIGHT IJ CATHETER PLACEMENT UNDER ULTRASOUND AND FLUOROSCOPIC
GUIDANCE
TECHNIQUE: Patency of the left IJ vein was confirmed with ultrasound with image
documentation. An appropriate skin site was determined. Skin site
was marked. Region was prepped using maximum barrier technique
including cap and mask, sterile gown, sterile gloves, large sterile
sheet, and Chlorhexidine as cutaneous antisepsis. The region was
infiltrated locally with 1% lidocaine. Under real-time ultrasound
guidance, the left IJ vein was accessed with micropuncture set; the
needle tip within the vein was confirmed with ultrasound image
documentation. The micropuncture dilator over a guidewire for
vascular dilator which allowed advancement of a 20 cm Trialysis
catheter. This was positioned with the tip at the cavoatrial
junction. Spot chest radiograph shows good positioning and no
pneumothorax. Catheter was flushed and sutured externally with
0-Prolene sutures. Patient tolerated the procedure well.
FLUOROSCOPY TIME:  0.2 minute; 11 uCymW DAP
COMPLICATIONS:
COMPLICATIONS
none

[Series 1: fl(-)  angio sharp · 1 of 1 slices shown]
[im 1/1]
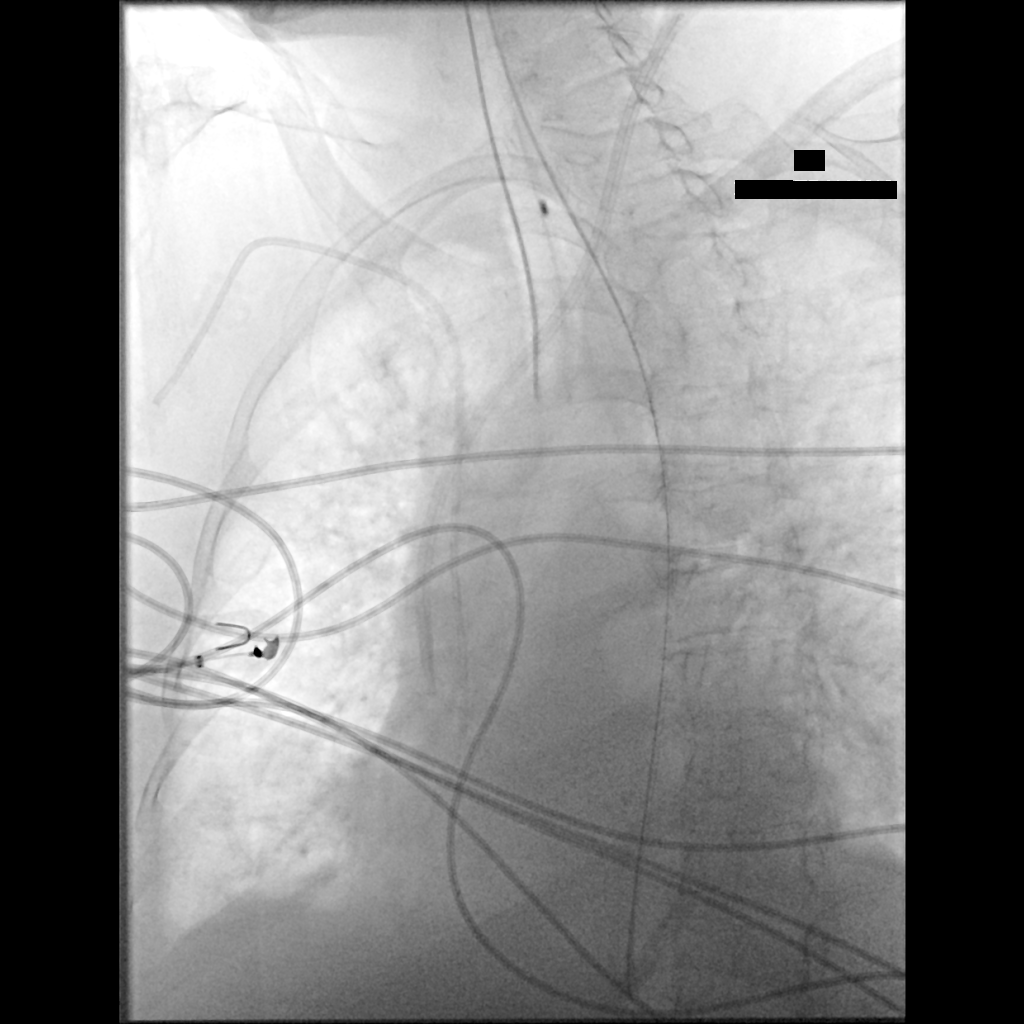

[1 of 1 positions shown; findings below may reference images not displayed]

IMPRESSION: 1. Technically successful left IJ Trialysis catheter placement.
